# Patient Record
Sex: Female | Born: 1965
Health system: Southern US, Community
[De-identification: ages and names within clinical notes are randomized; demographics above are authoritative.]

## PROBLEM LIST (undated history)

## (undated) DIAGNOSIS — I10 Essential (primary) hypertension: Secondary | ICD-10-CM

## (undated) HISTORY — DX: Essential (primary) hypertension: I10

---

## 2008-08-26 HISTORY — PX: LEG SURGERY: SHX1003

## 2010-10-27 ENCOUNTER — Encounter: Payer: Self-pay | Admitting: Family Medicine

## 2010-10-27 ENCOUNTER — Ambulatory Visit (INDEPENDENT_AMBULATORY_CARE_PROVIDER_SITE_OTHER): Payer: BC Managed Care – PPO | Admitting: Family Medicine

## 2010-10-27 VITALS — BP 140/88 | Temp 100.2°F | Ht 64.0 in | Wt 138.4 lb

## 2010-10-27 DIAGNOSIS — Z0000 Encounter for general adult medical examination without abnormal findings: Secondary | ICD-10-CM | POA: Insufficient documentation

## 2010-10-27 LAB — CBC WITH DIFFERENTIAL/PLATELET
Eosinophils Absolute: 0.1 10*3/uL (ref 0.0–0.7)
Eosinophils Relative: 1 % (ref 0.0–5.0)
HCT: 37.7 % (ref 36.0–46.0)
Lymphs Abs: 1.5 10*3/uL (ref 0.7–4.0)
MCHC: 32.7 g/dL (ref 30.0–36.0)
MCV: 84.3 fl (ref 78.0–100.0)
Monocytes Absolute: 0.4 10*3/uL (ref 0.1–1.0)
Neutrophils Relative %: 64.3 % (ref 43.0–77.0)
Platelets: 247 10*3/uL (ref 150.0–400.0)
RDW: 12.6 % (ref 11.5–14.6)

## 2010-10-27 NOTE — Progress Notes (Signed)
  Subjective:    Patient ID: Margaret Gibson, female    DOB: Aug 14, 1965, 45 y.o.   MRN: [id number]  HPI New to establish.  Recently moved from [place].  UTD on pap and mammo.  Family hx of hyperlipidemia- mom w/ high cholesterol, pt has been watching diet, exercising regularly, taking fish oil.  Pt is hoping to avoid medicine.  Would like fasting labs today.   Review of Systems Patient reports no vision/ hearing  changes, adenopathy, fever, weight change,  persistant / recurrent hoarseness, swallowing issues, chest pain,palpitations,edema,persistant/recurrent cough, hemoptysis, dyspnea (rest/ exertional/paroxysmal nocturnal), gastrointestinal bleeding(melena, rectal bleeding), abdominal pain, significant heartburn, bowel changes,GU symptoms(dysuria, hematuria,pyuria, incontinence), Gyn symptoms(abnormal  bleeding, pain), syncope, focal weakness, memory loss,numbness & tingling, skin/hair/nail changes, abnormal bruising or bleeding, anxiety,or depression.    Objective:   Physical Exam BP 140/88  Temp(Src) 100.2 F (37.9 C) (Oral)  Ht 5\' 4"  (1.626 m)  Wt 138 lb 6 oz (62.766 kg)  BMI 23.75 kg/m2  General Appearance:    Alert, cooperative, no distress, appears stated age  Head:    Normocephalic, without obvious abnormality, atraumatic  Eyes:    PERRL, conjunctiva/corneas clear, EOM's intact, fundi    benign, both eyes  Ears:    Normal TM's and external ear canals, both ears  Nose:   Nares normal, septum midline, mucosa normal, no drainage    or sinus tenderness  Throat:   Lips, mucosa, and tongue normal; teeth and gums normal  Neck:   Supple, symmetrical, trachea midline, no adenopathy;    thyroid:  no enlargement/tenderness/nodules  Back:     Symmetric, no curvature, ROM normal, no CVA tenderness  Lungs:     Clear to auscultation bilaterally, respirations unlabored  Chest Wall:    No tenderness or deformity   Heart:    Regular rate and rhythm, S1 and S2 normal, no murmur, rub   or  gallop  Breast Exam:    GYN  Abdomen:     Soft, non-tender, bowel sounds active all four quadrants,    no masses, no organomegaly  Genitalia:    GYN  Rectal:    GYN  Extremities:   Extremities normal, atraumatic, no cyanosis or edema  Pulses:   2+ and symmetric all extremities  Skin:   Skin color, texture, turgor normal, no rashes or lesions  Lymph nodes:   Cervical, supraclavicular, and axillary nodes normal  Neurologic:   CNII-XII intact, normal strength, sensation and reflexes    throughout         Assessment & Plan:

## 2010-10-27 NOTE — Patient Instructions (Signed)
We'll notify you of your lab results Your exam looks great!  Keep up the good work! Call with any questions or concerns Welcome!  We're glad to have you!

## 2010-10-27 NOTE — Assessment & Plan Note (Signed)
Pt's PE WNL.  Check labs.  UTD on health maintenance.  Anticipatory guidance provided.

## 2010-10-28 LAB — BASIC METABOLIC PANEL
Calcium: 9.2 mg/dL (ref 8.4–10.5)
GFR: 79.52 mL/min (ref >60.00)
Glucose, Bld: 78 mg/dL (ref 70–99)
Potassium: 4.2 mEq/L (ref 3.5–5.1)
Sodium: 139 mEq/L (ref 135–145)

## 2010-10-28 LAB — HEPATIC FUNCTION PANEL
AST: 20 U/L (ref 0–37)
Albumin: 3.7 g/dL (ref 3.5–5.2)
Alkaline Phosphatase: 45 U/L (ref 39–117)
Total Bilirubin: 0.6 mg/dL (ref 0.3–1.2)

## 2010-10-28 LAB — LIPID PANEL
HDL: 62.9 mg/dL (ref >39.00)
Triglycerides: 98 mg/dL (ref 0.0–149.0)
VLDL: 19.6 mg/dL (ref 0.0–40.0)

## 2010-10-28 LAB — TSH: TSH: 2.18 u[IU]/mL (ref 0.35–5.50)

## 2010-11-02 ENCOUNTER — Telehealth: Payer: Self-pay | Admitting: *Deleted

## 2010-11-02 LAB — VITAMIN D 1,25 DIHYDROXY: Vitamin D 1, 25 (OH)2 Total: 70 pg/mL (ref 18–72)

## 2010-11-02 NOTE — Telephone Encounter (Signed)
Message copied by Army Fossa on Mon Nov 02, 2010  3:43 PM ------      Message from: Neena Rhymes      Created: Mon Nov 02, 2010  2:00 PM       Pt's total cholesterol and LDL are both elevated.  i know she is trying to avoid meds but i would recommend starting low dose Simvastatin 20mg  nightly to decrease risk of heart attack and stroke.  Recheck LFTs in 6-8 weeks.            Remainder of labs look great!

## 2010-11-02 NOTE — Telephone Encounter (Signed)
Message left for patient to return my call.  

## 2010-11-03 NOTE — Telephone Encounter (Signed)
I spoke w/ pt she declines starting meds at this time.

## 2011-10-04 ENCOUNTER — Encounter: Payer: Self-pay | Admitting: Family Medicine

## 2011-10-04 ENCOUNTER — Other Ambulatory Visit (HOSPITAL_COMMUNITY)
Admission: RE | Admit: 2011-10-04 | Discharge: 2011-10-04 | Disposition: A | Discharge status: Home / Self Care | Payer: BC Managed Care – PPO | Source: Ambulatory Visit | Attending: Family Medicine | Admitting: Family Medicine

## 2011-10-04 ENCOUNTER — Ambulatory Visit (INDEPENDENT_AMBULATORY_CARE_PROVIDER_SITE_OTHER): Payer: BC Managed Care – PPO | Admitting: Family Medicine

## 2011-10-04 DIAGNOSIS — Z124 Encounter for screening for malignant neoplasm of cervix: Secondary | ICD-10-CM

## 2011-10-04 DIAGNOSIS — Z01419 Encounter for gynecological examination (general) (routine) without abnormal findings: Secondary | ICD-10-CM | POA: Insufficient documentation

## 2011-10-04 DIAGNOSIS — Z0000 Encounter for general adult medical examination without abnormal findings: Secondary | ICD-10-CM

## 2011-10-04 LAB — POCT URINALYSIS DIPSTICK
Bilirubin, UA: NEGATIVE
Glucose, UA: NEGATIVE
Leukocytes, UA: NEGATIVE
Nitrite, UA: NEGATIVE
Urobilinogen, UA: 0.2

## 2011-10-04 LAB — TSH: TSH: 1.93 u[IU]/mL (ref 0.35–5.50)

## 2011-10-04 LAB — BASIC METABOLIC PANEL
BUN: 14 mg/dL (ref 6–23)
Chloride: 105 mEq/L (ref 96–112)
Creatinine, Ser: 0.9 mg/dL (ref 0.4–1.2)
Glucose, Bld: 79 mg/dL (ref 70–99)

## 2011-10-04 LAB — CBC WITH DIFFERENTIAL/PLATELET
Basophils Relative: 0.4 % (ref 0.0–3.0)
Eosinophils Absolute: 0.1 10*3/uL (ref 0.0–0.7)
Hemoglobin: 12.2 g/dL (ref 12.0–15.0)
Lymphocytes Relative: 27.6 % (ref 12.0–46.0)
MCHC: 32.4 g/dL (ref 30.0–36.0)
MCV: 85.2 fl (ref 78.0–100.0)
Neutro Abs: 3.7 10*3/uL (ref 1.4–7.7)
RBC: 4.42 Mil/uL (ref 3.87–5.11)

## 2011-10-04 LAB — LIPID PANEL
Cholesterol: 226 mg/dL — ABNORMAL HIGH (ref 0–200)
VLDL: 14.8 mg/dL (ref 0.0–40.0)

## 2011-10-04 LAB — LDL CHOLESTEROL, DIRECT: Direct LDL: 131.5 mg/dL

## 2011-10-04 LAB — HEPATIC FUNCTION PANEL
ALT: 16 U/L (ref 0–35)
Albumin: 4 g/dL (ref 3.5–5.2)
Total Bilirubin: 0.2 mg/dL — ABNORMAL LOW (ref 0.3–1.2)
Total Protein: 7.8 g/dL (ref 6.0–8.3)

## 2011-10-04 NOTE — Assessment & Plan Note (Signed)
Pt's PE WNL.  UTD on mammo.  Check labs.  Anticipatory guidance provided.  

## 2011-10-04 NOTE — Progress Notes (Signed)
  Subjective:    Patient ID: Margaret Gibson, female    DOB: March 12, 1966, 46 y.o.   MRN: [id number]  HPI CPE- no concerns.  UTD on mammo (09/22/11).  Due for pap today.   Review of Systems Patient reports no vision/ hearing changes, adenopathy,fever, weight change,  persistant/recurrent hoarseness , swallowing issues, chest pain, palpitations, edema, persistant/recurrent cough, hemoptysis, dyspnea (rest/exertional/paroxysmal nocturnal), gastrointestinal bleeding (melena, rectal bleeding), abdominal pain, significant heartburn, bowel changes, GU symptoms (dysuria, hematuria, incontinence), Gyn symptoms (abnormal  bleeding, pain),  syncope, focal weakness, memory loss, numbness & tingling, skin/hair/nail changes, abnormal bruising or bleeding, anxiety, or depression.     Objective:   Physical Exam  General Appearance:    Alert, cooperative, no distress, appears stated age  Head:    Normocephalic, without obvious abnormality, atraumatic  Eyes:    PERRL, conjunctiva/corneas clear, EOM's intact, fundi    benign, both eyes  Ears:    Normal TM's and external ear canals, both ears  Nose:   Nares normal, septum midline, mucosa normal, no drainage    or sinus tenderness  Throat:   Lips, mucosa, and tongue normal; teeth and gums normal  Neck:   Supple, symmetrical, trachea midline, no adenopathy;    Thyroid: no enlargement/tenderness/nodules  Back:     Symmetric, no curvature, ROM normal, no CVA tenderness  Lungs:     Clear to auscultation bilaterally, respirations unlabored  Chest Wall:    No tenderness or deformity   Heart:    Regular rate and rhythm, S1 and S2 normal, no murmur, rub   or gallop  Breast Exam:    No tenderness, masses, or nipple abnormality  Abdomen:     Soft, non-tender, bowel sounds active all four quadrants,    no masses, no organomegaly  Genitalia:    External genitalia normal, cervix normal in appearance, no CMT, uterus in normal size and position, adnexa w/out mass or  tenderness, mucosa pink and moist, no lesions or discharge present  Rectal:    Normal external appearance  Extremities:   Extremities normal, atraumatic, no cyanosis or edema  Pulses:   2+ and symmetric all extremities  Skin:   Skin color, texture, turgor normal, no rashes or lesions  Lymph nodes:   Cervical, supraclavicular, and axillary nodes normal  Neurologic:   CNII-XII intact, normal strength, sensation and reflexes    throughout          Assessment & Plan:

## 2011-10-04 NOTE — Assessment & Plan Note (Signed)
Pap collected. 

## 2011-10-04 NOTE — Patient Instructions (Signed)
Follow up in 1 year or as needed Keep up the good work!  You look great! We'll notify you of your lab results Call with any questions or concerns Happy Spring!!! 

## 2011-10-04 NOTE — Progress Notes (Signed)
Addended by: Silvio Pate D on: 10/04/2011 02:52 PM   Modules accepted: Orders

## 2011-10-06 ENCOUNTER — Encounter: Payer: Self-pay | Admitting: *Deleted

## 2011-10-06 LAB — URINE CULTURE: Organism ID, Bacteria: NO GROWTH

## 2011-10-06 LAB — VITAMIN D 1,25 DIHYDROXY: Vitamin D2 1, 25 (OH)2: <8 pg/mL

## 2011-10-18 ENCOUNTER — Encounter: Payer: Self-pay | Admitting: Family Medicine

## 2011-11-08 ENCOUNTER — Other Ambulatory Visit: Payer: Self-pay | Admitting: Family Medicine

## 2011-11-08 ENCOUNTER — Telehealth: Payer: Self-pay | Admitting: Family Medicine

## 2011-11-08 MED ORDER — ETONOGESTREL-ETHINYL ESTRADIOL 0.12-0.015 MG/24HR VA RING: VAGINAL_RING | VAGINAL | Status: DC

## 2011-11-08 NOTE — Telephone Encounter (Signed)
Patient states her nuvaring should have been called into the pharmacy after her office visit in March and it was not. She now needs this rx today. Dr. Beverely Low has never prescribed this for her. [facility] [address].

## 2011-11-08 NOTE — Telephone Encounter (Signed)
noted 

## 2011-11-08 NOTE — Telephone Encounter (Signed)
Please note

## 2011-11-08 NOTE — Telephone Encounter (Signed)
Patient called to check status of rx refill from earlier today. I advised pt that her request had been sent, that we were working on her request, and that Dr. Beverely Low must approve all rx requests. Patient stated that she was on her way to the pharmacy and that the rx better be ready. Patient was very upset and stated she was coming into the office to obtain a paper copy of the rx to take to the pharmacy. Dr. Beverely Low and Janna Arch were notified. Patient came into office, obtained paper copy of rx from me, and left.

## 2012-09-09 ENCOUNTER — Other Ambulatory Visit: Payer: Self-pay

## 2012-10-06 ENCOUNTER — Other Ambulatory Visit: Payer: Self-pay | Admitting: Family Medicine

## 2012-10-31 ENCOUNTER — Ambulatory Visit (INDEPENDENT_AMBULATORY_CARE_PROVIDER_SITE_OTHER): Payer: BC Managed Care – PPO | Admitting: Family Medicine

## 2012-10-31 ENCOUNTER — Encounter: Payer: Self-pay | Admitting: Family Medicine

## 2012-10-31 VITALS — BP 148/92 | HR 87 | Temp 98.4°F | Ht 64.5 in | Wt 143.0 lb

## 2012-10-31 DIAGNOSIS — Z0000 Encounter for general adult medical examination without abnormal findings: Secondary | ICD-10-CM

## 2012-10-31 DIAGNOSIS — R030 Elevated blood-pressure reading, without diagnosis of hypertension: Secondary | ICD-10-CM

## 2012-10-31 DIAGNOSIS — IMO0001 Reserved for inherently not codable concepts without codable children: Secondary | ICD-10-CM | POA: Insufficient documentation

## 2012-10-31 DIAGNOSIS — Z1331 Encounter for screening for depression: Secondary | ICD-10-CM

## 2012-10-31 LAB — LIPID PANEL
Cholesterol: 228 mg/dL — ABNORMAL HIGH (ref 0–200)
HDL: 65.6 mg/dL (ref >39.00)
Total CHOL/HDL Ratio: 3
VLDL: 10.4 mg/dL (ref 0.0–40.0)

## 2012-10-31 LAB — BASIC METABOLIC PANEL
CO2: 25 mEq/L (ref 19–32)
Calcium: 9.2 mg/dL (ref 8.4–10.5)
Glucose, Bld: 85 mg/dL (ref 70–99)
Sodium: 139 mEq/L (ref 135–145)

## 2012-10-31 LAB — TSH: TSH: 1.85 u[IU]/mL (ref 0.35–5.50)

## 2012-10-31 LAB — CBC WITH DIFFERENTIAL/PLATELET
Eosinophils Relative: 1.1 % (ref 0.0–5.0)
HCT: 36.9 % (ref 36.0–46.0)
Hemoglobin: 12.2 g/dL (ref 12.0–15.0)
Lymphs Abs: 1.1 10*3/uL (ref 0.7–4.0)
Monocytes Relative: 7.7 % (ref 3.0–12.0)
Platelets: 240 10*3/uL (ref 150.0–400.0)
WBC: 4.6 10*3/uL (ref 4.5–10.5)

## 2012-10-31 LAB — HEPATIC FUNCTION PANEL: Alkaline Phosphatase: 47 U/L (ref 39–117)

## 2012-10-31 NOTE — Patient Instructions (Addendum)
Follow up in 1 month to recheck BP Keep up the good work! We'll notify you of your lab results and make any changes if needed Happy Spring!!!

## 2012-10-31 NOTE — Assessment & Plan Note (Signed)
Pt's PE WNL w/ exception of BP.  Check labs.  UTD on health maintenance.  Anticipatory guidance provided.

## 2012-10-31 NOTE — Assessment & Plan Note (Signed)
New.  Pt reports hx of high BP at [facility] b/c 'i just get so excited'.  Pt reports usually this normalizes when she sits for awhile.  BP today much higher than previous.  Will follow up in 1 month to recheck.  If no improvement, will start low dose meds.  Pt expressed understanding and is in agreement w/ plan.

## 2012-10-31 NOTE — Progress Notes (Signed)
  Subjective:    Patient ID: Margaret Gibson, female    DOB: [birth year], 47 y.o.   MRN: [id number]  HPI CPE- UTD on pap (2013), had mammo in Feb.  No  Concerns today.   Review of Systems Patient reports no vision/ hearing changes, adenopathy,fever, weight change,  persistant/recurrent hoarseness , swallowing issues, chest pain, palpitations, edema, persistant/recurrent cough, hemoptysis, dyspnea (rest/exertional/paroxysmal nocturnal), gastrointestinal bleeding (melena, rectal bleeding), abdominal pain, significant heartburn, bowel changes, GU symptoms (dysuria, hematuria, incontinence), Gyn symptoms (abnormal  bleeding, pain),  syncope, focal weakness, memory loss, numbness & tingling, skin/hair/nail changes, abnormal bruising or bleeding, anxiety, or depression.     Objective:   Physical Exam General Appearance:    Alert, cooperative, no distress, appears stated age  Head:    Normocephalic, without obvious abnormality, atraumatic  Eyes:    PERRL, conjunctiva/corneas clear, EOM's intact, fundi    benign, both eyes  Ears:    Normal TM's and external ear canals, both ears  Nose:   Nares normal, septum midline, mucosa normal, no drainage    or sinus tenderness  Throat:   Lips, mucosa, and tongue normal; teeth and gums normal  Neck:   Supple, symmetrical, trachea midline, no adenopathy;    Thyroid: no enlargement/tenderness/nodules  Back:     Symmetric, no curvature, ROM normal, no CVA tenderness  Lungs:     Clear to auscultation bilaterally, respirations unlabored  Chest Wall:    No tenderness or deformity   Heart:    Regular rate and rhythm, S1 and S2 normal, no murmur, rub   or gallop  Breast Exam:    Deferred to mammo  Abdomen:     Soft, non-tender, bowel sounds active all four quadrants,    no masses, no organomegaly  Genitalia:    Deferred  Rectal:    Extremities:   Extremities normal, atraumatic, no cyanosis or edema  Pulses:   2+ and symmetric all extremities  Skin:   Skin color,  texture, turgor normal, no rashes or lesions  Lymph nodes:   Cervical, supraclavicular, and axillary nodes normal  Neurologic:   CNII-XII intact, normal strength, sensation and reflexes    throughout          Assessment & Plan:

## 2012-11-01 ENCOUNTER — Other Ambulatory Visit: Payer: Self-pay | Admitting: *Deleted

## 2012-11-01 MED ORDER — ETONOGESTREL-ETHINYL ESTRADIOL 0.12-0.015 MG/24HR VA RING: VAGINAL_RING | VAGINAL | Status: DC

## 2012-11-01 NOTE — Telephone Encounter (Signed)
Rx sent 

## 2013-05-31 ENCOUNTER — Other Ambulatory Visit: Payer: Self-pay

## 2013-09-25 LAB — HM MAMMOGRAPHY: HM MAMMO: NORMAL

## 2013-09-27 ENCOUNTER — Encounter: Payer: Self-pay | Admitting: Family Medicine

## 2013-11-02 ENCOUNTER — Telehealth: Payer: Self-pay

## 2013-11-02 NOTE — Telephone Encounter (Signed)
Left message for call back Non-identifiable   Pap- 10/04/11-normal MMG- 09/25/13- benign findings Flu Td

## 2013-11-02 NOTE — Telephone Encounter (Signed)
Patient did call you back but she stated that she is at work and does not need a call back because everything is the same.

## 2013-11-05 ENCOUNTER — Encounter: Payer: Self-pay | Admitting: Family Medicine

## 2013-11-05 ENCOUNTER — Ambulatory Visit (INDEPENDENT_AMBULATORY_CARE_PROVIDER_SITE_OTHER): Payer: BC Managed Care – PPO | Admitting: Family Medicine

## 2013-11-05 VITALS — BP 134/84 | HR 104 | Temp 98.4°F | Resp 16 | Ht 65.0 in | Wt 142.5 lb

## 2013-11-05 DIAGNOSIS — Z0000 Encounter for general adult medical examination without abnormal findings: Secondary | ICD-10-CM

## 2013-11-05 DIAGNOSIS — Z23 Encounter for immunization: Secondary | ICD-10-CM

## 2013-11-05 LAB — LIPID PANEL
CHOL/HDL RATIO: 3
Cholesterol: 221 mg/dL — ABNORMAL HIGH (ref 0–200)
HDL: 74.3 mg/dL (ref >39.00)
LDL CALC: 135 mg/dL — AB (ref 0–99)
Triglycerides: 59 mg/dL (ref 0.0–149.0)
VLDL: 11.8 mg/dL (ref 0.0–40.0)

## 2013-11-05 LAB — HEPATIC FUNCTION PANEL
ALK PHOS: 49 U/L (ref 39–117)
ALT: 31 U/L (ref 0–35)
AST: 39 U/L — ABNORMAL HIGH (ref 0–37)
Albumin: 3.9 g/dL (ref 3.5–5.2)
BILIRUBIN DIRECT: 0 mg/dL (ref 0.0–0.3)
BILIRUBIN TOTAL: 0.2 mg/dL — AB (ref 0.3–1.2)
TOTAL PROTEIN: 8 g/dL (ref 6.0–8.3)

## 2013-11-05 LAB — CBC WITH DIFFERENTIAL/PLATELET
BASOS ABS: 0 10*3/uL (ref 0.0–0.1)
BASOS PCT: 0.5 % (ref 0.0–3.0)
EOS ABS: 0.1 10*3/uL (ref 0.0–0.7)
Eosinophils Relative: 1.4 % (ref 0.0–5.0)
HEMATOCRIT: 35.8 % — AB (ref 36.0–46.0)
HEMOGLOBIN: 11.5 g/dL — AB (ref 12.0–15.0)
LYMPHS ABS: 1.4 10*3/uL (ref 0.7–4.0)
LYMPHS PCT: 25 % (ref 12.0–46.0)
MCHC: 32.1 g/dL (ref 30.0–36.0)
MCV: 84.6 fl (ref 78.0–100.0)
Monocytes Absolute: 0.3 10*3/uL (ref 0.1–1.0)
Monocytes Relative: 5.7 % (ref 3.0–12.0)
NEUTROS ABS: 3.7 10*3/uL (ref 1.4–7.7)
Neutrophils Relative %: 67.4 % (ref 43.0–77.0)
Platelets: 222 10*3/uL (ref 150.0–400.0)
RBC: 4.24 Mil/uL (ref 3.87–5.11)
RDW: 12.7 % (ref 11.5–14.6)
WBC: 5.5 10*3/uL (ref 4.5–10.5)

## 2013-11-05 LAB — BASIC METABOLIC PANEL
BUN: 16 mg/dL (ref 6–23)
CALCIUM: 9.3 mg/dL (ref 8.4–10.5)
CO2: 25 mEq/L (ref 19–32)
Chloride: 104 mEq/L (ref 96–112)
Creatinine, Ser: 0.8 mg/dL (ref 0.4–1.2)
GFR: 78.45 mL/min (ref >60.00)
Glucose, Bld: 76 mg/dL (ref 70–99)
POTASSIUM: 4 meq/L (ref 3.5–5.1)
SODIUM: 138 meq/L (ref 135–145)

## 2013-11-05 LAB — TSH: TSH: 1.09 u[IU]/mL (ref 0.35–5.50)

## 2013-11-05 NOTE — Progress Notes (Signed)
   Subjective:    Patient ID: Margaret Gibson, female    DOB: 1966/03/20, 48 y.o.   MRN: [id number]  HPI CPE- UTD on pap and mammo.  No concerns today.   Review of Systems Patient reports no vision/ hearing changes, adenopathy,fever, weight change,  persistant/recurrent hoarseness , swallowing issues, chest pain, palpitations, edema, persistant/recurrent cough, hemoptysis, dyspnea (rest/exertional/paroxysmal nocturnal), gastrointestinal bleeding (melena, rectal bleeding), abdominal pain, significant heartburn, bowel changes, GU symptoms (dysuria, hematuria, incontinence), Gyn symptoms (abnormal  bleeding, pain),  syncope, focal weakness, memory loss, numbness & tingling, skin/hair/nail changes, abnormal bruising or bleeding, anxiety, or depression.     Objective:   Physical Exam General Appearance:    Alert, cooperative, no distress, appears stated age  Head:    Normocephalic, without obvious abnormality, atraumatic  Eyes:    PERRL, conjunctiva/corneas clear, EOM's intact, fundi    benign, both eyes  Ears:    Normal TM's and external ear canals, both ears  Nose:   Nares normal, septum midline, mucosa normal, no drainage    or sinus tenderness  Throat:   Lips, mucosa, and tongue normal; teeth and gums normal  Neck:   Supple, symmetrical, trachea midline, no adenopathy;    Thyroid: no enlargement/tenderness/nodules  Back:     Symmetric, no curvature, ROM normal, no CVA tenderness  Lungs:     Clear to auscultation bilaterally, respirations unlabored  Chest Wall:    No tenderness or deformity   Heart:    Regular rate and rhythm, S1 and S2 normal, no murmur, rub   or gallop  Breast Exam:    Deferred to mammo  Abdomen:     Soft, non-tender, bowel sounds active all four quadrants,    no masses, no organomegaly  Genitalia:    Deferred to GYN  Rectal:    Extremities:   Extremities normal, atraumatic, no cyanosis or edema  Pulses:   2+ and symmetric all extremities  Skin:   Skin color,  texture, turgor normal, no rashes or lesions  Lymph nodes:   Cervical, supraclavicular, and axillary nodes normal  Neurologic:   CNII-XII intact, normal strength, sensation and reflexes    throughout          Assessment & Plan:

## 2013-11-05 NOTE — Patient Instructions (Signed)
Follow up in 1 year or as needed We'll notify you of your lab results and make any changes if needed Keep up the good work!  You look great! Call with any questions or concerns Happy Spring!!! 

## 2013-11-05 NOTE — Assessment & Plan Note (Signed)
Pt's PE WNL.  UTD on pap and mammo.  Check labs.  Tdap updated today.  Anticipatory guidance provided.

## 2013-11-05 NOTE — Addendum Note (Signed)
Addended by: Jackson LatinoYLER, JESSICA L on: 11/05/2013 08:38 AM   Modules accepted: Orders

## 2013-11-05 NOTE — Progress Notes (Signed)
Pre visit review using our clinic review tool, if applicable. No additional management support is needed unless otherwise documented below in the visit note. 

## 2013-11-15 LAB — VITAMIN D 1,25 DIHYDROXY
Vitamin D 1, 25 (OH)2 Total: 76 pg/mL — ABNORMAL HIGH (ref 18–72)
Vitamin D3 1, 25 (OH)2: 76 pg/mL

## 2013-11-29 ENCOUNTER — Other Ambulatory Visit: Payer: Self-pay | Admitting: Family Medicine

## 2013-11-29 NOTE — Telephone Encounter (Signed)
Med filled.  

## 2014-09-26 LAB — HM MAMMOGRAPHY

## 2014-10-01 ENCOUNTER — Encounter: Payer: Self-pay | Admitting: General Practice

## 2014-11-01 ENCOUNTER — Telehealth: Payer: Self-pay | Admitting: Family Medicine

## 2014-11-01 NOTE — Telephone Encounter (Signed)
Caller name: Anne NgKerrie Relation to pt: self Call back number: [phone] Pharmacy:  Reason for call:   Patient refuses to wait until August for her cpe.(we had to reschedule). Patient is wanting to know if she can [facility] Dr. Beverely Lowabori.

## 2014-11-01 NOTE — Telephone Encounter (Signed)
Left detailed message informing patient of this. °

## 2014-11-01 NOTE — Telephone Encounter (Signed)
OK with me.

## 2014-11-01 NOTE — Telephone Encounter (Signed)
Ok for pt to transfer but not sure this will get pt seen sooner (not aware of Melissa's availability)

## 2014-11-11 ENCOUNTER — Other Ambulatory Visit: Payer: Self-pay | Admitting: Family Medicine

## 2014-11-11 NOTE — Telephone Encounter (Signed)
Med filled.  

## 2014-11-20 ENCOUNTER — Other Ambulatory Visit: Payer: Self-pay | Admitting: Family Medicine

## 2014-11-20 NOTE — Telephone Encounter (Signed)
Med filled.  

## 2014-11-25 ENCOUNTER — Other Ambulatory Visit: Payer: Self-pay | Admitting: Family Medicine

## 2014-11-25 NOTE — Telephone Encounter (Signed)
Med filled.  

## 2014-12-11 ENCOUNTER — Telehealth: Payer: Self-pay | Admitting: Family Medicine

## 2014-12-11 NOTE — Telephone Encounter (Signed)
Pre Visit letter sent  °

## 2014-12-26 ENCOUNTER — Encounter: Payer: Self-pay | Admitting: Family Medicine

## 2015-01-01 ENCOUNTER — Telehealth: Payer: Self-pay

## 2015-01-01 NOTE — Telephone Encounter (Signed)
Pre visit screen completed 

## 2015-01-02 ENCOUNTER — Encounter: Payer: Self-pay | Admitting: Family Medicine

## 2015-01-02 ENCOUNTER — Other Ambulatory Visit (HOSPITAL_COMMUNITY)
Admission: RE | Admit: 2015-01-02 | Discharge: 2015-01-02 | Disposition: A | Discharge status: Home / Self Care | Payer: BLUE CROSS/BLUE SHIELD | Source: Ambulatory Visit | Attending: Family Medicine | Admitting: Family Medicine

## 2015-01-02 ENCOUNTER — Ambulatory Visit (INDEPENDENT_AMBULATORY_CARE_PROVIDER_SITE_OTHER): Payer: BLUE CROSS/BLUE SHIELD | Admitting: Family Medicine

## 2015-01-02 VITALS — BP 128/82 | HR 83 | Temp 98.4°F | Resp 16 | Ht 65.25 in | Wt 140.2 lb

## 2015-01-02 DIAGNOSIS — Z124 Encounter for screening for malignant neoplasm of cervix: Secondary | ICD-10-CM | POA: Diagnosis not present

## 2015-01-02 DIAGNOSIS — Z0000 Encounter for general adult medical examination without abnormal findings: Secondary | ICD-10-CM

## 2015-01-02 DIAGNOSIS — Z1151 Encounter for screening for human papillomavirus (HPV): Secondary | ICD-10-CM | POA: Diagnosis present

## 2015-01-02 DIAGNOSIS — Z01419 Encounter for gynecological examination (general) (routine) without abnormal findings: Secondary | ICD-10-CM | POA: Diagnosis not present

## 2015-01-02 LAB — CBC WITH DIFFERENTIAL/PLATELET
BASOS PCT: 0.6 % (ref 0.0–3.0)
Basophils Absolute: 0 10*3/uL (ref 0.0–0.1)
EOS ABS: 0.1 10*3/uL (ref 0.0–0.7)
EOS PCT: 1.8 % (ref 0.0–5.0)
HEMATOCRIT: 38.3 % (ref 36.0–46.0)
HEMOGLOBIN: 12.1 g/dL (ref 12.0–15.0)
Lymphocytes Relative: 25.7 % (ref 12.0–46.0)
Lymphs Abs: 1.2 10*3/uL (ref 0.7–4.0)
MCHC: 31.7 g/dL (ref 30.0–36.0)
MCV: 84.6 fl (ref 78.0–100.0)
MONOS PCT: 7.4 % (ref 3.0–12.0)
Monocytes Absolute: 0.4 10*3/uL (ref 0.1–1.0)
Neutro Abs: 3.1 10*3/uL (ref 1.4–7.7)
Neutrophils Relative %: 64.5 % (ref 43.0–77.0)
Platelets: 244 10*3/uL (ref 150.0–400.0)
RBC: 4.53 Mil/uL (ref 3.87–5.11)
RDW: 12.6 % (ref 11.5–15.5)
WBC: 4.8 10*3/uL (ref 4.0–10.5)

## 2015-01-02 LAB — HEPATIC FUNCTION PANEL
ALBUMIN: 4.1 g/dL (ref 3.5–5.2)
ALT: 34 U/L (ref 0–35)
AST: 41 U/L — ABNORMAL HIGH (ref 0–37)
Alkaline Phosphatase: 59 U/L (ref 39–117)
BILIRUBIN DIRECT: 0.1 mg/dL (ref 0.0–0.3)
BILIRUBIN TOTAL: 0.6 mg/dL (ref 0.2–1.2)
TOTAL PROTEIN: 7.6 g/dL (ref 6.0–8.3)

## 2015-01-02 LAB — LIPID PANEL
CHOL/HDL RATIO: 3
Cholesterol: 233 mg/dL — ABNORMAL HIGH (ref 0–200)
HDL: 75.5 mg/dL (ref >39.00)
LDL Cholesterol: 145 mg/dL — ABNORMAL HIGH (ref 0–99)
NonHDL: 157.5
TRIGLYCERIDES: 61 mg/dL (ref 0.0–149.0)
VLDL: 12.2 mg/dL (ref 0.0–40.0)

## 2015-01-02 LAB — BASIC METABOLIC PANEL
BUN: 9 mg/dL (ref 6–23)
CO2: 28 meq/L (ref 19–32)
Calcium: 9.3 mg/dL (ref 8.4–10.5)
Chloride: 103 mEq/L (ref 96–112)
Creatinine, Ser: 0.89 mg/dL (ref 0.40–1.20)
GFR: 72.02 mL/min (ref >60.00)
Glucose, Bld: 81 mg/dL (ref 70–99)
Potassium: 3.7 mEq/L (ref 3.5–5.1)
SODIUM: 137 meq/L (ref 135–145)

## 2015-01-02 LAB — TSH: TSH: 1.04 u[IU]/mL (ref 0.35–4.50)

## 2015-01-02 LAB — VITAMIN D 25 HYDROXY (VIT D DEFICIENCY, FRACTURES): VITD: 45.75 ng/mL (ref 30.00–100.00)

## 2015-01-02 NOTE — Progress Notes (Signed)
Pre visit review using our clinic review tool, if applicable. No additional management support is needed unless otherwise documented below in the visit note. 

## 2015-01-02 NOTE — Patient Instructions (Signed)
Follow up in 1 year or as needed We'll notify you of your lab results and make any changes if needed Keep up the good work!  You look great! Call with any questions or concerns Have a great summer!!! 

## 2015-01-02 NOTE — Progress Notes (Signed)
   Subjective:    Patient ID: Margaret Gibson, female    DOB: 04-25-1966, 49 y.o.   MRN: [id number]  HPI CPE- due for pap.  UTD on mammo.  No concerns.   Review of Systems Patient reports no vision/ hearing changes, adenopathy,fever, weight change,  persistant/recurrent hoarseness , swallowing issues, chest pain, palpitations, edema, persistant/recurrent cough, hemoptysis, dyspnea (rest/exertional/paroxysmal nocturnal), gastrointestinal bleeding (melena, rectal bleeding), abdominal pain, significant heartburn, bowel changes, GU symptoms (dysuria, hematuria, incontinence), Gyn symptoms (abnormal  bleeding, pain),  syncope, focal weakness, memory loss, numbness & tingling, skin/hair/nail changes, abnormal bruising or bleeding, anxiety, or depression.     Objective:   Physical Exam  General Appearance:    Alert, cooperative, no distress, appears stated age  Head:    Normocephalic, without obvious abnormality, atraumatic  Eyes:    PERRL, conjunctiva/corneas clear, EOM's intact, fundi    benign, both eyes  Ears:    Normal TM's and external ear canals, both ears  Nose:   Nares normal, septum midline, mucosa normal, no drainage    or sinus tenderness  Throat:   Lips, mucosa, and tongue normal; teeth and gums normal  Neck:   Supple, symmetrical, trachea midline, no adenopathy;    Thyroid: no enlargement/tenderness/nodules  Back:     Symmetric, no curvature, ROM normal, no CVA tenderness  Lungs:     Clear to auscultation bilaterally, respirations unlabored  Chest Wall:    No tenderness or deformity   Heart:    Regular rate and rhythm, S1 and S2 normal, no murmur, rub   or gallop  Breast Exam:    No tenderness, masses, or nipple abnormality  Abdomen:     Soft, non-tender, bowel sounds active all four quadrants,    no masses, no organomegaly  Genitalia:    External genitalia normal, cervix normal in appearance, no CMT, uterus in normal size and position, adnexa w/out mass or tenderness, mucosa  pink and moist, no lesions or discharge present  Rectal:    Normal external appearance  Extremities:   Extremities normal, atraumatic, no cyanosis or edema  Pulses:   2+ and symmetric all extremities  Skin:   Skin color, texture, turgor normal, no rashes or lesions  Lymph nodes:   Cervical, supraclavicular, and axillary nodes normal  Neurologic:   CNII-XII intact, normal strength, sensation and reflexes    throughout          Assessment & Plan:

## 2015-01-02 NOTE — Assessment & Plan Note (Signed)
Pap collected. 

## 2015-01-02 NOTE — Assessment & Plan Note (Signed)
Pt's PE WNL.  UTD on mammo.  Pap collected today.  Check labs.  Anticipatory guidance provided.  

## 2015-01-03 LAB — CYTOLOGY - PAP

## 2015-09-04 ENCOUNTER — Telehealth: Payer: Self-pay | Admitting: Family Medicine

## 2015-09-04 MED ORDER — ETONOGESTREL-ETHINYL ESTRADIOL 0.12-0.015 MG/24HR VA RING: VAGINAL_RING | VAGINAL | Status: DC

## 2015-09-04 NOTE — Telephone Encounter (Signed)
Relation to ZO:XWRU Call back [phone] Pharmacy: [id number] - [place],  - [address] AT [facility] [address] [facility] [address] [phone] (Phone) [phone] (Fax)         Reason for call:  Patient requesting a refill NUVARING 0.12-0.015 MG/24HR vaginal ring

## 2015-09-04 NOTE — Telephone Encounter (Signed)
CPE within the last year, patient is transferring care.  Rx faxed.     KP

## 2015-09-28 ENCOUNTER — Other Ambulatory Visit: Payer: Self-pay | Admitting: Family Medicine

## 2015-09-29 NOTE — Telephone Encounter (Signed)
Medication filled to pharmacy as requested.   

## 2015-09-30 LAB — HM MAMMOGRAPHY

## 2015-10-03 ENCOUNTER — Encounter: Payer: Self-pay | Admitting: Family Medicine

## 2015-12-08 ENCOUNTER — Ambulatory Visit: Payer: BLUE CROSS/BLUE SHIELD | Admitting: Family Medicine

## 2015-12-15 ENCOUNTER — Ambulatory Visit: Payer: BLUE CROSS/BLUE SHIELD | Admitting: Family Medicine

## 2016-01-12 ENCOUNTER — Encounter: Payer: BLUE CROSS/BLUE SHIELD | Admitting: Family Medicine

## 2016-02-02 ENCOUNTER — Other Ambulatory Visit: Payer: Self-pay | Admitting: Family Medicine

## 2016-02-02 ENCOUNTER — Encounter: Payer: Self-pay | Admitting: Family Medicine

## 2016-02-02 ENCOUNTER — Ambulatory Visit (INDEPENDENT_AMBULATORY_CARE_PROVIDER_SITE_OTHER): Payer: BLUE CROSS/BLUE SHIELD | Admitting: Family Medicine

## 2016-02-02 VITALS — BP 156/84 | HR 87 | Temp 97.9°F | Ht 65.0 in | Wt 135.8 lb

## 2016-02-02 DIAGNOSIS — R030 Elevated blood-pressure reading, without diagnosis of hypertension: Secondary | ICD-10-CM

## 2016-02-02 DIAGNOSIS — K625 Hemorrhage of anus and rectum: Secondary | ICD-10-CM | POA: Diagnosis not present

## 2016-02-02 DIAGNOSIS — Z0000 Encounter for general adult medical examination without abnormal findings: Secondary | ICD-10-CM | POA: Diagnosis not present

## 2016-02-02 DIAGNOSIS — Z3040 Encounter for surveillance of contraceptives, unspecified: Secondary | ICD-10-CM | POA: Diagnosis not present

## 2016-02-02 DIAGNOSIS — IMO0001 Reserved for inherently not codable concepts without codable children: Secondary | ICD-10-CM

## 2016-02-02 LAB — CBC WITH DIFFERENTIAL/PLATELET
BASOS ABS: 0.1 10*3/uL (ref 0.0–0.1)
BASOS PCT: 1 % (ref 0.0–3.0)
EOS ABS: 0 10*3/uL (ref 0.0–0.7)
Eosinophils Relative: 0.2 % (ref 0.0–5.0)
HEMATOCRIT: 36.7 % (ref 36.0–46.0)
HEMOGLOBIN: 11.9 g/dL — AB (ref 12.0–15.0)
LYMPHS PCT: 21.4 % (ref 12.0–46.0)
Lymphs Abs: 1.4 10*3/uL (ref 0.7–4.0)
MCHC: 32.4 g/dL (ref 30.0–36.0)
MCV: 82.6 fl (ref 78.0–100.0)
MONO ABS: 0.3 10*3/uL (ref 0.1–1.0)
Monocytes Relative: 5.1 % (ref 3.0–12.0)
NEUTROS ABS: 4.8 10*3/uL (ref 1.4–7.7)
Neutrophils Relative %: 72.3 % (ref 43.0–77.0)
Platelets: 228 10*3/uL (ref 150.0–400.0)
RBC: 4.44 Mil/uL (ref 3.87–5.11)
RDW: 12.6 % (ref 11.5–15.5)
WBC: 6.6 10*3/uL (ref 4.0–10.5)

## 2016-02-02 LAB — POCT URINALYSIS DIPSTICK
Bilirubin, UA: NEGATIVE
Blood, UA: NEGATIVE
Glucose, UA: NEGATIVE
Leukocytes, UA: NEGATIVE
Nitrite, UA: NEGATIVE
PROTEIN UA: NEGATIVE
UROBILINOGEN UA: 0.2
pH, UA: 5.5

## 2016-02-02 LAB — COMPREHENSIVE METABOLIC PANEL
ALBUMIN: 4.4 g/dL (ref 3.5–5.2)
ALT: 13 U/L (ref 0–35)
AST: 22 U/L (ref 0–37)
Alkaline Phosphatase: 46 U/L (ref 39–117)
BILIRUBIN TOTAL: 0.6 mg/dL (ref 0.2–1.2)
BUN: 14 mg/dL (ref 6–23)
CALCIUM: 9.7 mg/dL (ref 8.4–10.5)
CHLORIDE: 102 meq/L (ref 96–112)
CO2: 24 mEq/L (ref 19–32)
CREATININE: 0.85 mg/dL (ref 0.40–1.20)
GFR: 75.6 mL/min (ref >60.00)
Glucose, Bld: 77 mg/dL (ref 70–99)
Potassium: 4.1 mEq/L (ref 3.5–5.1)
SODIUM: 134 meq/L — AB (ref 135–145)
Total Protein: 8 g/dL (ref 6.0–8.3)

## 2016-02-02 LAB — LIPID PANEL
CHOL/HDL RATIO: 3
Cholesterol: 228 mg/dL — ABNORMAL HIGH (ref 0–200)
HDL: 74.2 mg/dL (ref >39.00)
LDL CALC: 139 mg/dL — AB (ref 0–99)
NonHDL: 153.65
Triglycerides: 71 mg/dL (ref 0.0–149.0)
VLDL: 14.2 mg/dL (ref 0.0–40.0)

## 2016-02-02 LAB — TSH: TSH: 1.34 u[IU]/mL (ref 0.35–4.50)

## 2016-02-02 MED ORDER — ETONOGESTREL-ETHINYL ESTRADIOL 0.12-0.015 MG/24HR VA RING: VAGINAL_RING | VAGINAL | Status: DC

## 2016-02-02 NOTE — Progress Notes (Signed)
Subjective:     Margaret Gibson is a 50 y.o. female and is here for a comprehensive physical exam. The patient reports no problems.  Social History   Social History  . Marital Status: Married    Spouse Name: N/A  . Number of Children: N/A  . Years of Education: N/A   Occupational History  .  [employer]   Social History Main Topics  . Smoking status: Never Smoker   . Smokeless tobacco: Not on file  . Alcohol Use: No  . Drug Use: No  . Sexual Activity: Yes   Other Topics Concern  . Not on file   Social History Narrative   Exercise -- pedometer 10.000 steps   Health Maintenance  Topic Date Due  . HIV Screening  02/01/2017 (Originally 03/25/1982)  . INFLUENZA VACCINE  02/24/2016  . MAMMOGRAM  09/29/2016  . PAP SMEAR  01/01/2018  . TETANUS/TDAP  11/06/2023    The following portions of the patient's history were reviewed and updated as appropriate:  She  has no past medical history on file. She  does not have any pertinent problems on file. She  has past surgical history that includes Leg Surgery (08/2008). Her family history includes Cancer (age of onset: 4846) in her sister; Hyperlipidemia in her mother; Hypertension in her father. She  reports that she has never smoked. She does not have any smokeless tobacco history on file. She reports that she does not drink alcohol or use illicit drugs. She has a current medication list which includes the following prescription(s): etonogestrel-ethinyl estradiol, flaxseed (linseed), garlic, hair/skin/nails, and fish oil triple strength. Current Outpatient Prescriptions on File Prior to Visit  Medication Sig Dispense Refill  . Flaxseed, Linseed, (FLAX SEED OIL PO) Take by mouth daily.      Marland Kitchen. GARLIC PO Take by mouth.    . Multiple Vitamins-Minerals (HAIR/SKIN/NAILS) TABS Take by mouth daily.      . Omega-3 Fatty Acids (FISH OIL TRIPLE STRENGTH) 1400 MG CAPS Take by mouth daily.       No current facility-administered  medications on file prior to visit.   She has No Known Allergies..  Review of Systems Review of Systems   Constitutional: Negative for activity change, appetite change and fatigue.  HENT: Negative for hearing loss, congestion, tinnitus and ear discharge.  dentist [id number] Eyes: Negative for visual disturbance (see optho q1y -- vision corrected to 20/20 with glasses).  Respiratory: Negative for cough, chest tightness and shortness of breath.   Cardiovascular: Negative for chest pain, palpitations and leg swelling.  Gastrointestinal: Negative for abdominal pain, diarrhea, constipation and abdominal distention.  Genitourinary: Negative for urgency, frequency, decreased urine volume and difficulty urinating.  Musculoskeletal: Negative for back pain, arthralgias and gait problem.  Skin: Negative for color change, pallor and rash.  Neurological: Negative for dizziness, light-headedness, numbness and headaches.  Hematological: Negative for adenopathy. Does not bruise/bleed easily.  Psychiatric/Behavioral: Negative for suicidal ideas, confusion, sleep disturbance, self-injury, dysphoric mood, decreased concentration and agitation.       Objective:    BP 156/84 mmHg  Pulse 87  Temp(Src) 97.9 F (36.6 C) (Oral)  Ht 5\' 5"  (1.651 m)  Wt 135 lb 12.8 oz (61.598 kg)  BMI 22.60 kg/m2  SpO2 99%  LMP 01/19/2016 General appearance: alert, cooperative, appears stated age and no distress Head: Normocephalic, without obvious abnormality, atraumatic Eyes: conjunctivae/corneas clear. PERRL, EOM's intact. Fundi benign. Ears: normal TM's and external ear canals both ears Nose: Nares  normal. Septum midline. Mucosa normal. No drainage or sinus tenderness. Throat: lips, mucosa, and tongue normal; teeth and gums normal Neck: no adenopathy, no carotid bruit, no JVD, supple, symmetrical, trachea midline and thyroid not enlarged, symmetric, no tenderness/mass/nodules Back: symmetric, no curvature. ROM normal. No  CVA tenderness. Lungs: clear to auscultation bilaterally Breasts: normal appearance, no masses or tenderness Heart: regular rate and rhythm, S1, S2 normal, no murmur, click, rub or gallop Abdomen: soft, non-tender; bowel sounds normal; no masses,  no organomegaly Pelvic: deferred Extremities: extremities normal, atraumatic, no cyanosis or edema Pulses: 2+ and symmetric Skin: Skin color, texture, turgor normal. No rashes or lesions Lymph nodes: Cervical, supraclavicular, and axillary nodes normal. Neurologic: Alert and oriented X 3, normal strength and tone. Normal symmetric reflexes. Normal coordination and gait Psych- no depression, no anxiety      Assessment:    Healthy female exam.       Plan:    ghm utd Check labs See avs See After Visit Summary for Counseling Recommendations

## 2016-02-02 NOTE — Progress Notes (Signed)
Pre visit review using our clinic review tool, if applicable. No additional management support is needed unless otherwise documented below in the visit note. 

## 2016-02-02 NOTE — Assessment & Plan Note (Signed)
Pt states it is always high in drs office and then goes down She will monitor at home and send in bp readings

## 2016-02-02 NOTE — Patient Instructions (Signed)
Preventive Care for Adults, Female A healthy lifestyle and preventive care can promote health and wellness. Preventive health guidelines for women include the following key practices.  A routine yearly physical is a good way to check with your health care provider about your health and preventive screening. It is a chance to share any concerns and updates on your health and to receive a thorough exam.  Visit your dentist for a routine exam and preventive care every 6 months. Brush your teeth twice a day and floss once a day. Good oral hygiene prevents tooth decay and gum disease.  The frequency of eye exams is based on your age, health, family medical history, use of contact lenses, and other factors. Follow your health care provider's recommendations for frequency of eye exams.  Eat a healthy diet. Foods like vegetables, fruits, whole grains, low-fat dairy products, and lean protein foods contain the nutrients you need without too many calories. Decrease your intake of foods high in solid fats, added sugars, and salt. Eat the right amount of calories for you.Get information about a proper diet from your health care provider, if necessary.  Regular physical exercise is one of the most important things you can do for your health. Most adults should get at least 150 minutes of moderate-intensity exercise (any activity that increases your heart rate and causes you to sweat) each week. In addition, most adults need muscle-strengthening exercises on 2 or more days a week.  Maintain a healthy weight. The body mass index (BMI) is a screening tool to identify possible weight problems. It provides an estimate of body fat based on height and weight. Your health care provider can find your BMI and can help you achieve or maintain a healthy weight.For adults 20 years and older:  A BMI below 18.5 is considered underweight.  A BMI of 18.5 to 24.9 is normal.  A BMI of 25 to 29.9 is considered overweight.  A  BMI of 30 and above is considered obese.  Maintain normal blood lipids and cholesterol levels by exercising and minimizing your intake of saturated fat. Eat a balanced diet with plenty of fruit and vegetables. Blood tests for lipids and cholesterol should begin at age 45 and be repeated every 5 years. If your lipid or cholesterol levels are high, you are over 50, or you are at high risk for heart disease, you may need your cholesterol levels checked more frequently.Ongoing high lipid and cholesterol levels should be treated with medicines if diet and exercise are not working.  If you smoke, find out from your health care provider how to quit. If you do not use tobacco, do not start.  Lung cancer screening is recommended for adults aged 45-80 years who are at high risk for developing lung cancer because of a history of smoking. A yearly low-dose CT scan of the lungs is recommended for people who have at least a 30-pack-year history of smoking and are a current smoker or have quit within the past 15 years. A pack year of smoking is smoking an average of 1 pack of cigarettes a day for 1 year (for example: 1 pack a day for 30 years or 2 packs a day for 15 years). Yearly screening should continue until the smoker has stopped smoking for at least 15 years. Yearly screening should be stopped for people who develop a health problem that would prevent them from having lung cancer treatment.  If you are pregnant, do not drink alcohol. If you are  breastfeeding, be very cautious about drinking alcohol. If you are not pregnant and choose to drink alcohol, do not have more than 1 drink per day. One drink is considered to be 12 ounces (355 mL) of beer, 5 ounces (148 mL) of wine, or 1.5 ounces (44 mL) of liquor.  Avoid use of street drugs. Do not share needles with anyone. Ask for help if you need support or instructions about stopping the use of drugs.  High blood pressure causes heart disease and increases the risk  of stroke. Your blood pressure should be checked at least every 1 to 2 years. Ongoing high blood pressure should be treated with medicines if weight loss and exercise do not work.  If you are 55-79 years old, ask your health care provider if you should take aspirin to prevent strokes.  Diabetes screening is done by taking a blood sample to check your blood glucose level after you have not eaten for a certain period of time (fasting). If you are not overweight and you do not have risk factors for diabetes, you should be screened once every 3 years starting at age 45. If you are overweight or obese and you are 40-70 years of age, you should be screened for diabetes every year as part of your cardiovascular risk assessment.  Breast cancer screening is essential preventive care for women. You should practice "breast self-awareness." This means understanding the normal appearance and feel of your breasts and may include breast self-examination. Any changes detected, no matter how small, should be reported to a health care provider. Women in their 20s and 30s should have a clinical breast exam (CBE) by a health care provider as part of a regular health exam every 1 to 3 years. After age 40, women should have a CBE every year. Starting at age 40, women should consider having a mammogram (breast X-ray test) every year. Women who have a family history of breast cancer should talk to their health care provider about genetic screening. Women at a high risk of breast cancer should talk to their health care providers about having an MRI and a mammogram every year.  Breast cancer gene (BRCA)-related cancer risk assessment is recommended for women who have family members with BRCA-related cancers. BRCA-related cancers include breast, ovarian, tubal, and peritoneal cancers. Having family members with these cancers may be associated with an increased risk for harmful changes (mutations) in the breast cancer genes BRCA1 and  BRCA2. Results of the assessment will determine the need for genetic counseling and BRCA1 and BRCA2 testing.  Your health care provider may recommend that you be screened regularly for cancer of the pelvic organs (ovaries, uterus, and vagina). This screening involves a pelvic examination, including checking for microscopic changes to the surface of your cervix (Pap test). You may be encouraged to have this screening done every 3 years, beginning at age 21.  For women ages 30-65, health care providers may recommend pelvic exams and Pap testing every 3 years, or they may recommend the Pap and pelvic exam, combined with testing for human papilloma virus (HPV), every 5 years. Some types of HPV increase your risk of cervical cancer. Testing for HPV may also be done on women of any age with unclear Pap test results.  Other health care providers may not recommend any screening for nonpregnant women who are considered low risk for pelvic cancer and who do not have symptoms. Ask your health care provider if a screening pelvic exam is right for   you.  If you have had past treatment for cervical cancer or a condition that could lead to cancer, you need Pap tests and screening for cancer for at least 20 years after your treatment. If Pap tests have been discontinued, your risk factors (such as having a new sexual partner) need to be reassessed to determine if screening should resume. Some women have medical problems that increase the chance of getting cervical cancer. In these cases, your health care provider may recommend more frequent screening and Pap tests.  Colorectal cancer can be detected and often prevented. Most routine colorectal cancer screening begins at the age of 50 years and continues through age 75 years. However, your health care provider may recommend screening at an earlier age if you have risk factors for colon cancer. On a yearly basis, your health care provider may provide home test kits to check  for hidden blood in the stool. Use of a small camera at the end of a tube, to directly examine the colon (sigmoidoscopy or colonoscopy), can detect the earliest forms of colorectal cancer. Talk to your health care provider about this at age 50, when routine screening begins. Direct exam of the colon should be repeated every 5-10 years through age 75 years, unless early forms of precancerous polyps or small growths are found.  People who are at an increased risk for hepatitis B should be screened for this virus. You are considered at high risk for hepatitis B if:  You were born in a country where hepatitis B occurs often. Talk with your health care provider about which countries are considered high risk.  Your parents were born in a high-risk country and you have not received a shot to protect against hepatitis B (hepatitis B vaccine).  You have HIV or AIDS.  You use needles to inject street drugs.  You live with, or have sex with, someone who has hepatitis B.  You get hemodialysis treatment.  You take certain medicines for conditions like cancer, organ transplantation, and autoimmune conditions.  Hepatitis C blood testing is recommended for all people born from 1945 through 1965 and any individual with known risks for hepatitis C.  Practice safe sex. Use condoms and avoid high-risk sexual practices to reduce the spread of sexually transmitted infections (STIs). STIs include gonorrhea, chlamydia, syphilis, trichomonas, herpes, HPV, and human immunodeficiency virus (HIV). Herpes, HIV, and HPV are viral illnesses that have no cure. They can result in disability, cancer, and death.  You should be screened for sexually transmitted illnesses (STIs) including gonorrhea and chlamydia if:  You are sexually active and are younger than 24 years.  You are older than 24 years and your health care provider tells you that you are at risk for this type of infection.  Your sexual activity has changed  since you were last screened and you are at an increased risk for chlamydia or gonorrhea. Ask your health care provider if you are at risk.  If you are at risk of being infected with HIV, it is recommended that you take a prescription medicine daily to prevent HIV infection. This is called preexposure prophylaxis (PrEP). You are considered at risk if:  You are sexually active and do not regularly use condoms or know the HIV status of your partner(s).  You take drugs by injection.  You are sexually active with a partner who has HIV.  Talk with your health care provider about whether you are at high risk of being infected with HIV. If   you choose to begin PrEP, you should first be tested for HIV. You should then be tested every 3 months for as long as you are taking PrEP.  Osteoporosis is a disease in which the bones lose minerals and strength with aging. This can result in serious bone fractures or breaks. The risk of osteoporosis can be identified using a bone density scan. Women ages 67 years and over and women at risk for fractures or osteoporosis should discuss screening with their health care providers. Ask your health care provider whether you should take a calcium supplement or vitamin D to reduce the rate of osteoporosis.  Menopause can be associated with physical symptoms and risks. Hormone replacement therapy is available to decrease symptoms and risks. You should talk to your health care provider about whether hormone replacement therapy is right for you.  Use sunscreen. Apply sunscreen liberally and repeatedly throughout the day. You should seek shade when your shadow is shorter than you. Protect yourself by wearing long sleeves, pants, a wide-brimmed hat, and sunglasses year round, whenever you are outdoors.  Once a month, do a whole body skin exam, using a mirror to look at the skin on your back. Tell your health care provider of new moles, moles that have irregular borders, moles that  are larger than a pencil eraser, or moles that have changed in shape or color.  Stay current with required vaccines (immunizations).  Influenza vaccine. All adults should be immunized every year.  Tetanus, diphtheria, and acellular pertussis (Td, Tdap) vaccine. Pregnant women should receive 1 dose of Tdap vaccine during each pregnancy. The dose should be obtained regardless of the length of time since the last dose. Immunization is preferred during the 27th-36th week of gestation. An adult who has not previously received Tdap or who does not know her vaccine status should receive 1 dose of Tdap. This initial dose should be followed by tetanus and diphtheria toxoids (Td) booster doses every 10 years. Adults with an unknown or incomplete history of completing a 3-dose immunization series with Td-containing vaccines should begin or complete a primary immunization series including a Tdap dose. Adults should receive a Td booster every 10 years.  Varicella vaccine. An adult without evidence of immunity to varicella should receive 2 doses or a second dose if she has previously received 1 dose. Pregnant females who do not have evidence of immunity should receive the first dose after pregnancy. This first dose should be obtained before leaving the health care facility. The second dose should be obtained 4-8 weeks after the first dose.  Human papillomavirus (HPV) vaccine. Females aged 13-26 years who have not received the vaccine previously should obtain the 3-dose series. The vaccine is not recommended for use in pregnant females. However, pregnancy testing is not needed before receiving a dose. If a female is found to be pregnant after receiving a dose, no treatment is needed. In that case, the remaining doses should be delayed until after the pregnancy. Immunization is recommended for any person with an immunocompromised condition through the age of 61 years if she did not get any or all doses earlier. During the  3-dose series, the second dose should be obtained 4-8 weeks after the first dose. The third dose should be obtained 24 weeks after the first dose and 16 weeks after the second dose.  Zoster vaccine. One dose is recommended for adults aged 30 years or older unless certain conditions are present.  Measles, mumps, and rubella (MMR) vaccine. Adults born  before 1957 generally are considered immune to measles and mumps. Adults born in 1957 or later should have 1 or more doses of MMR vaccine unless there is a contraindication to the vaccine or there is laboratory evidence of immunity to each of the three diseases. A routine second dose of MMR vaccine should be obtained at least 28 days after the first dose for students attending postsecondary schools, health care workers, or international travelers. People who received inactivated measles vaccine or an unknown type of measles vaccine during 1963-1967 should receive 2 doses of MMR vaccine. People who received inactivated mumps vaccine or an unknown type of mumps vaccine before 1979 and are at high risk for mumps infection should consider immunization with 2 doses of MMR vaccine. For females of childbearing age, rubella immunity should be determined. If there is no evidence of immunity, females who are not pregnant should be vaccinated. If there is no evidence of immunity, females who are pregnant should delay immunization until after pregnancy. Unvaccinated health care workers born before 1957 who lack laboratory evidence of measles, mumps, or rubella immunity or laboratory confirmation of disease should consider measles and mumps immunization with 2 doses of MMR vaccine or rubella immunization with 1 dose of MMR vaccine.  Pneumococcal 13-valent conjugate (PCV13) vaccine. When indicated, a person who is uncertain of his immunization history and has no record of immunization should receive the PCV13 vaccine. All adults 65 years of age and older should receive this  vaccine. An adult aged 19 years or older who has certain medical conditions and has not been previously immunized should receive 1 dose of PCV13 vaccine. This PCV13 should be followed with a dose of pneumococcal polysaccharide (PPSV23) vaccine. Adults who are at high risk for pneumococcal disease should obtain the PPSV23 vaccine at least 8 weeks after the dose of PCV13 vaccine. Adults older than 49 years of age who have normal immune system function should obtain the PPSV23 vaccine dose at least 1 year after the dose of PCV13 vaccine.  Pneumococcal polysaccharide (PPSV23) vaccine. When PCV13 is also indicated, PCV13 should be obtained first. All adults aged 65 years and older should be immunized. An adult younger than age 65 years who has certain medical conditions should be immunized. Any person who resides in a nursing home or long-term care facility should be immunized. An adult smoker should be immunized. People with an immunocompromised condition and certain other conditions should receive both PCV13 and PPSV23 vaccines. People with human immunodeficiency virus (HIV) infection should be immunized as soon as possible after diagnosis. Immunization during chemotherapy or radiation therapy should be avoided. Routine use of PPSV23 vaccine is not recommended for American Indians, [place] Natives, or people younger than 65 years unless there are medical conditions that require PPSV23 vaccine. When indicated, people who have unknown immunization and have no record of immunization should receive PPSV23 vaccine. One-time revaccination 5 years after the first dose of PPSV23 is recommended for people aged 19-64 years who have chronic kidney failure, nephrotic syndrome, asplenia, or immunocompromised conditions. People who received 1-2 doses of PPSV23 before age 65 years should receive another dose of PPSV23 vaccine at age 65 years or later if at least 5 years have passed since the previous dose. Doses of PPSV23 are not  needed for people immunized with PPSV23 at or after age 65 years.  Meningococcal vaccine. Adults with asplenia or persistent complement component deficiencies should receive 2 doses of quadrivalent meningococcal conjugate (MenACWY-D) vaccine. The doses should be obtained   at least 2 months apart. Microbiologists working with certain meningococcal bacteria, [place] recruits, people at risk during an outbreak, and people who travel to or live in countries with a high rate of meningitis should be immunized. A first-year college student up through age 34 years who is living in a residence hall should receive a dose if she did not receive a dose on or after her 16th birthday. Adults who have certain high-risk conditions should receive one or more doses of vaccine.  Hepatitis A vaccine. Adults who wish to be protected from this disease, have certain high-risk conditions, work with hepatitis A-infected animals, work in hepatitis A research labs, or travel to or work in countries with a high rate of hepatitis A should be immunized. Adults who were previously unvaccinated and who anticipate close contact with an international adoptee during the first 60 days after arrival in the [place] from a country with a high rate of hepatitis A should be immunized.  Hepatitis B vaccine. Adults who wish to be protected from this disease, have certain high-risk conditions, may be exposed to blood or other infectious body fluids, are household contacts or sex partners of hepatitis B positive people, are clients or workers in certain care facilities, or travel to or work in countries with a high rate of hepatitis B should be immunized.  Haemophilus influenzae type b (Hib) vaccine. A previously unvaccinated person with asplenia or sickle cell disease or having a scheduled splenectomy should receive 1 dose of Hib vaccine. Regardless of previous immunization, a recipient of a hematopoietic stem cell transplant should receive a  3-dose series 6-12 months after her successful transplant. Hib vaccine is not recommended for adults with HIV infection. Preventive Services / Frequency Ages 35 to 4 years  Blood pressure check.** / Every 3-5 years.  Lipid and cholesterol check.** / Every 5 years beginning at age 60.  Clinical breast exam.** / Every 3 years for women in their 71s and 10s.  BRCA-related cancer risk assessment.** / For women who have family members with a BRCA-related cancer (breast, ovarian, tubal, or peritoneal cancers).  Pap test.** / Every 2 years from ages 76 through 26. Every 3 years starting at age 61 through age 76 or 93 with a history of 3 consecutive normal Pap tests.  HPV screening.** / Every 3 years from ages 37 through ages 60 to 51 with a history of 3 consecutive normal Pap tests.  Hepatitis C blood test.** / For any individual with known risks for hepatitis C.  Skin self-exam. / Monthly.  Influenza vaccine. / Every year.  Tetanus, diphtheria, and acellular pertussis (Tdap, Td) vaccine.** / Consult your health care provider. Pregnant women should receive 1 dose of Tdap vaccine during each pregnancy. 1 dose of Td every 10 years.  Varicella vaccine.** / Consult your health care provider. Pregnant females who do not have evidence of immunity should receive the first dose after pregnancy.  HPV vaccine. / 3 doses over 6 months, if 93 and younger. The vaccine is not recommended for use in pregnant females. However, pregnancy testing is not needed before receiving a dose.  Measles, mumps, rubella (MMR) vaccine.** / You need at least 1 dose of MMR if you were born in 1957 or later. You may also need a 2nd dose. For females of childbearing age, rubella immunity should be determined. If there is no evidence of immunity, females who are not pregnant should be vaccinated. If there is no evidence of immunity, females who are  pregnant should delay immunization until after pregnancy.  Pneumococcal  13-valent conjugate (PCV13) vaccine.** / Consult your health care provider.  Pneumococcal polysaccharide (PPSV23) vaccine.** / 1 to 2 doses if you smoke cigarettes or if you have certain conditions.  Meningococcal vaccine.** / 1 dose if you are age 68 to 8 years and a Market researcher living in a residence hall, or have one of several medical conditions, you need to get vaccinated against meningococcal disease. You may also need additional booster doses.  Hepatitis A vaccine.** / Consult your health care provider.  Hepatitis B vaccine.** / Consult your health care provider.  Haemophilus influenzae type b (Hib) vaccine.** / Consult your health care provider. Ages 7 to 53 years  Blood pressure check.** / Every year.  Lipid and cholesterol check.** / Every 5 years beginning at age 25 years.  Lung cancer screening. / Every year if you are aged 11-80 years and have a 30-pack-year history of smoking and currently smoke or have quit within the past 15 years. Yearly screening is stopped once you have quit smoking for at least 15 years or develop a health problem that would prevent you from having lung cancer treatment.  Clinical breast exam.** / Every year after age 48 years.  BRCA-related cancer risk assessment.** / For women who have family members with a BRCA-related cancer (breast, ovarian, tubal, or peritoneal cancers).  Mammogram.** / Every year beginning at age 41 years and continuing for as long as you are in good health. Consult with your health care provider.  Pap test.** / Every 3 years starting at age 65 years through age 37 or 70 years with a history of 3 consecutive normal Pap tests.  HPV screening.** / Every 3 years from ages 72 years through ages 60 to 40 years with a history of 3 consecutive normal Pap tests.  Fecal occult blood test (FOBT) of stool. / Every year beginning at age 21 years and continuing until age 5 years. You may not need to do this test if you get  a colonoscopy every 10 years.  Flexible sigmoidoscopy or colonoscopy.** / Every 5 years for a flexible sigmoidoscopy or every 10 years for a colonoscopy beginning at age 35 years and continuing until age 48 years.  Hepatitis C blood test.** / For all people born from [birth year] through 1965 and any individual with known risks for hepatitis C.  Skin self-exam. / Monthly.  Influenza vaccine. / Every year.  Tetanus, diphtheria, and acellular pertussis (Tdap/Td) vaccine.** / Consult your health care provider. Pregnant women should receive 1 dose of Tdap vaccine during each pregnancy. 1 dose of Td every 10 years.  Varicella vaccine.** / Consult your health care provider. Pregnant females who do not have evidence of immunity should receive the first dose after pregnancy.  Zoster vaccine.** / 1 dose for adults aged 30 years or older.  Measles, mumps, rubella (MMR) vaccine.** / You need at least 1 dose of MMR if you were born in 1957 or later. You may also need a second dose. For females of childbearing age, rubella immunity should be determined. If there is no evidence of immunity, females who are not pregnant should be vaccinated. If there is no evidence of immunity, females who are pregnant should delay immunization until after pregnancy.  Pneumococcal 13-valent conjugate (PCV13) vaccine.** / Consult your health care provider.  Pneumococcal polysaccharide (PPSV23) vaccine.** / 1 to 2 doses if you smoke cigarettes or if you have certain conditions.  Meningococcal vaccine.** /  Consult your health care provider.  Hepatitis A vaccine.** / Consult your health care provider.  Hepatitis B vaccine.** / Consult your health care provider.  Haemophilus influenzae type b (Hib) vaccine.** / Consult your health care provider. Ages 64 years and over  Blood pressure check.** / Every year.  Lipid and cholesterol check.** / Every 5 years beginning at age 23 years.  Lung cancer screening. / Every year if you  are aged 16-80 years and have a 30-pack-year history of smoking and currently smoke or have quit within the past 15 years. Yearly screening is stopped once you have quit smoking for at least 15 years or develop a health problem that would prevent you from having lung cancer treatment.  Clinical breast exam.** / Every year after age 74 years.  BRCA-related cancer risk assessment.** / For women who have family members with a BRCA-related cancer (breast, ovarian, tubal, or peritoneal cancers).  Mammogram.** / Every year beginning at age 44 years and continuing for as long as you are in good health. Consult with your health care provider.  Pap test.** / Every 3 years starting at age 58 years through age 22 or 39 years with 3 consecutive normal Pap tests. Testing can be stopped between 65 and 70 years with 3 consecutive normal Pap tests and no abnormal Pap or HPV tests in the past 10 years.  HPV screening.** / Every 3 years from ages 64 years through ages 70 or 61 years with a history of 3 consecutive normal Pap tests. Testing can be stopped between 65 and 70 years with 3 consecutive normal Pap tests and no abnormal Pap or HPV tests in the past 10 years.  Fecal occult blood test (FOBT) of stool. / Every year beginning at age 40 years and continuing until age 27 years. You may not need to do this test if you get a colonoscopy every 10 years.  Flexible sigmoidoscopy or colonoscopy.** / Every 5 years for a flexible sigmoidoscopy or every 10 years for a colonoscopy beginning at age 7 years and continuing until age 32 years.  Hepatitis C blood test.** / For all people born from [birth year] through 1964 and any individual with known risks for hepatitis C.  Osteoporosis screening.** / A one-time screening for women ages 30 years and over and women at risk for fractures or osteoporosis.  Skin self-exam. / Monthly.  Influenza vaccine. / Every year.  Tetanus, diphtheria, and acellular pertussis (Tdap/Td)  vaccine.** / 1 dose of Td every 10 years.  Varicella vaccine.** / Consult your health care provider.  Zoster vaccine.** / 1 dose for adults aged 35 years or older.  Pneumococcal 13-valent conjugate (PCV13) vaccine.** / Consult your health care provider.  Pneumococcal polysaccharide (PPSV23) vaccine.** / 1 dose for all adults aged 46 years and older.  Meningococcal vaccine.** / Consult your health care provider.  Hepatitis A vaccine.** / Consult your health care provider.  Hepatitis B vaccine.** / Consult your health care provider.  Haemophilus influenzae type b (Hib) vaccine.** / Consult your health care provider. ** Family history and personal history of risk and conditions may change your health care provider's recommendations.   This information is not intended to replace advice given to you by your health care provider. Make sure you discuss any questions you have with your health care provider.   Document Released: 09/07/2001 Document Revised: 08/02/2014 Document Reviewed: 12/07/2010 Elsevier Interactive Patient Education [employer].

## 2016-05-20 ENCOUNTER — Telehealth: Payer: Self-pay | Admitting: Family Medicine

## 2016-05-20 DIAGNOSIS — Z3044 Encounter for surveillance of vaginal ring hormonal contraceptive device: Secondary | ICD-10-CM

## 2016-05-20 NOTE — Telephone Encounter (Signed)
Relation to NW:GNFApt:self Call back number:[phone] Pharmacy: [facility] [id number] - [place], [place] - [address]  Reason for call:  Patient states her etonogestrel-ethinyl estradiol (NUVARING) 0.12-0.015 MG/24HR vaginal ring fell out and she's fine requesting a new rx, please advise.

## 2016-05-21 MED ORDER — ETONOGESTREL-ETHINYL ESTRADIOL 0.12-0.015 MG/24HR VA RING: VAGINAL_RING | VAGINAL | 3 refills | Status: DC

## 2016-05-21 NOTE — Telephone Encounter (Signed)
[facility]ication [facility] to [facility]

## 2016-06-06 ENCOUNTER — Other Ambulatory Visit: Payer: Self-pay | Admitting: Family Medicine

## 2016-06-06 DIAGNOSIS — Z3040 Encounter for surveillance of contraceptives, unspecified: Secondary | ICD-10-CM

## 2016-09-27 ENCOUNTER — Other Ambulatory Visit: Payer: Self-pay | Admitting: Family Medicine

## 2016-09-27 DIAGNOSIS — Z3044 Encounter for surveillance of vaginal ring hormonal contraceptive device: Secondary | ICD-10-CM

## 2017-03-04 ENCOUNTER — Encounter: Payer: Self-pay | Admitting: Family Medicine

## 2017-03-04 DIAGNOSIS — Z3044 Encounter for surveillance of vaginal ring hormonal contraceptive device: Secondary | ICD-10-CM

## 2017-03-04 MED ORDER — ETONOGESTREL-ETHINYL ESTRADIOL 0.12-0.015 MG/24HR VA RING: VAGINAL_RING | VAGINAL | 0 refills | Status: DC

## 2017-03-04 NOTE — Addendum Note (Signed)
Addended by: Scharlene GlossEWING, ROBIN B on: 03/04/2017 01:15 PM   Modules accepted: Orders

## 2017-03-11 ENCOUNTER — Other Ambulatory Visit: Payer: Self-pay | Admitting: Family Medicine

## 2017-03-11 DIAGNOSIS — Z3044 Encounter for surveillance of vaginal ring hormonal contraceptive device: Secondary | ICD-10-CM

## 2017-03-11 MED ORDER — ETONOGESTREL-ETHINYL ESTRADIOL 0.12-0.015 MG/24HR VA RING: VAGINAL_RING | VAGINAL | 0 refills | Status: DC

## 2017-03-11 NOTE — Telephone Encounter (Signed)
Pt says that she have to have a 90 day supply so that her insurance will cover.   Please assist further

## 2017-03-29 ENCOUNTER — Ambulatory Visit (INDEPENDENT_AMBULATORY_CARE_PROVIDER_SITE_OTHER): Payer: BLUE CROSS/BLUE SHIELD | Admitting: Family Medicine

## 2017-03-29 ENCOUNTER — Encounter: Payer: Self-pay | Admitting: Family Medicine

## 2017-03-29 ENCOUNTER — Other Ambulatory Visit: Payer: Self-pay | Admitting: Family Medicine

## 2017-03-29 VITALS — BP 160/90 | HR 90 | Temp 98.5°F | Ht 65.0 in | Wt 139.0 lb

## 2017-03-29 DIAGNOSIS — I10 Essential (primary) hypertension: Secondary | ICD-10-CM | POA: Diagnosis not present

## 2017-03-29 DIAGNOSIS — Z0000 Encounter for general adult medical examination without abnormal findings: Secondary | ICD-10-CM

## 2017-03-29 DIAGNOSIS — Z1231 Encounter for screening mammogram for malignant neoplasm of breast: Secondary | ICD-10-CM

## 2017-03-29 MED ORDER — METOPROLOL SUCCINATE ER 50 MG PO TB24: 50.0000 mg | ORAL_TABLET | Freq: Every day | ORAL | 2 refills | Status: DC

## 2017-03-29 NOTE — Progress Notes (Signed)
Subjective:     Margaret Gibson is a 51 y.o. female and is here for a comprehensive physical exam. The patient reports problems - bp running high-- family hx .  Social History   Social History  . Marital status: Married    Spouse name: N/A  . Number of children: N/A  . Years of education: N/A   Occupational History  .  [employer]   Social History Main Topics  . Smoking status: Never Smoker  . Smokeless tobacco: Never Used  . Alcohol use No  . Drug use: No  . Sexual activity: Yes   Other Topics Concern  . Not on file   Social History Narrative   Exercise -- pedometer 10.000 steps   Health Maintenance  Topic Date Due  . HIV Screening  03/25/1982  . MAMMOGRAM  09/29/2016  . INFLUENZA VACCINE  02/23/2017  . COLONOSCOPY  03/25/2017  . PAP SMEAR  01/01/2018  . TETANUS/TDAP  11/06/2023    The following portions of the patient's history were reviewed and updated as appropriate:  She  has no past medical history on file. She  does not have any pertinent problems on file. She  has a past surgical history that includes Leg Surgery (08/2008). Her family history includes Cancer (age of onset: 41) in her sister; Hyperlipidemia in her mother; Hypertension in her father. She  reports that she has never smoked. She has never used smokeless tobacco. She reports that she does not drink alcohol or use drugs. She has a current medication list which includes the following prescription(s): etonogestrel-ethinyl estradiol, flaxseed (linseed), garlic, hair/skin/nails, fish oil triple strength, and metoprolol succinate. Current Outpatient Prescriptions on File Prior to Visit  Medication Sig Dispense Refill  . etonogestrel-ethinyl estradiol (NUVARING) 0.12-0.015 MG/24HR vaginal ring Insert vaginally and leave in place for 3 consecutive weeks, then remove for 1 week. 3 each 0  . Flaxseed, Linseed, (FLAX SEED OIL PO) Take by mouth daily.      Marland Kitchen GARLIC PO Take by mouth.    . Multiple  Vitamins-Minerals (HAIR/SKIN/NAILS) TABS Take by mouth daily.      . Omega-3 Fatty Acids (FISH OIL TRIPLE STRENGTH) 1400 MG CAPS Take by mouth daily.       No current facility-administered medications on file prior to visit.    She has No Known Allergies..  Review of Systems Review of Systems  Constitutional: Negative for activity change, appetite change and fatigue.  HENT: Negative for hearing loss, congestion, tinnitus and ear discharge.  dentist q80m Eyes: Negative for visual disturbance (see optho q1y -- vision corrected to 20/20 with glasses).  Respiratory: Negative for cough, chest tightness and shortness of breath.   Cardiovascular: Negative for chest pain, palpitations and leg swelling.  Gastrointestinal: Negative for abdominal pain, diarrhea, constipation and abdominal distention.  Genitourinary: Negative for urgency, frequency, decreased urine volume and difficulty urinating.  Musculoskeletal: Negative for back pain, arthralgias and gait problem.  Skin: Negative for color change, pallor and rash.  Neurological: Negative for dizziness, light-headedness, numbness and headaches.  Hematological: Negative for adenopathy. Does not bruise/bleed easily.  Psychiatric/Behavioral: Negative for suicidal ideas, confusion, sleep disturbance, self-injury, dysphoric mood, decreased concentration and agitation.       Objective:    BP (!) 160/90 (BP Location: Left Arm, Cuff Size: Normal) Comment: This is a recheck, previous tap shows first reading  Pulse 90   Temp 98.5 F (36.9 C) (Oral)   Ht 5\' 5"  (1.651 m)  Wt 139 lb (63 kg)   LMP 03/22/2017   SpO2 98%   BMI 23.13 kg/m  General appearance: alert, cooperative, appears stated age and no distress Head: Normocephalic, without obvious abnormality, atraumatic Eyes: conjunctivae/corneas clear. PERRL, EOM's intact. Fundi benign. Ears: normal TM's and external ear canals both ears Nose: Nares normal. Septum midline. Mucosa normal. No  drainage or sinus tenderness. Throat: lips, mucosa, and tongue normal; teeth and gums normal Neck: no adenopathy, no carotid bruit, no JVD, supple, symmetrical, trachea midline and thyroid not enlarged, symmetric, no tenderness/mass/nodules Back: symmetric, no curvature. ROM normal. No CVA tenderness. Lungs: clear to auscultation bilaterally Breasts: normal appearance, no masses or tenderness Heart: regular rate and rhythm, S1, S2 normal, no murmur, click, rub or gallop Abdomen: soft, non-tender; bowel sounds normal; no masses,  no organomegaly Pelvic: deferred Extremities: extremities normal, atraumatic, no cyanosis or edema Pulses: 2+ and symmetric Skin: Skin color, texture, turgor normal. No rashes or lesions Lymph nodes: Cervical, supraclavicular, and axillary nodes normal. Neurologic: Alert and oriented X 3, normal strength and tone. Normal symmetric reflexes. Normal coordination and gait    Assessment:    Healthy female exam.      Plan:    ghm utd Check labs See After Visit Summary for Counseling Recommendations   Pt will schedule mammogram downstairs She will talk to pharmacy about shingrix  She refused flu shot 1. Essential hypertension Poorly controlled will alter medications, encouraged DASH diet, minimize caffeine and obtain adequate sleep. Report concerning symptoms and follow up as directed and as needed - metoprolol succinate (TOPROL XL) 50 MG 24 hr tablet; Take 1 tablet (50 mg total) by mouth daily. Take with or immediately following a meal.  Dispense: 30 tablet; Refill: 2

## 2017-03-29 NOTE — Patient Instructions (Signed)
Preventive Care 40-64 Years, Female Preventive care refers to lifestyle choices and visits with your health care provider that can promote health and wellness. What does preventive care include?  A yearly physical exam. This is also called an annual well check.  Dental exams once or twice a year.  Routine eye exams. Ask your health care provider how often you should have your eyes checked.  Personal lifestyle choices, including: ? Daily care of your teeth and gums. ? Regular physical activity. ? Eating a healthy diet. ? Avoiding tobacco and drug use. ? Limiting alcohol use. ? Practicing safe sex. ? Taking low-dose aspirin daily starting at age 58. ? Taking vitamin and mineral supplements as recommended by your health care provider. What happens during an annual well check? The services and screenings done by your health care provider during your annual well check will depend on your age, overall health, lifestyle risk factors, and family history of disease. Counseling Your health care provider may ask you questions about your:  Alcohol use.  Tobacco use.  Drug use.  Emotional well-being.  Home and relationship well-being.  Sexual activity.  Eating habits.  Work and work Statistician.  Method of birth control.  Menstrual cycle.  Pregnancy history.  Screening You may have the following tests or measurements:  Height, weight, and BMI.  Blood pressure.  Lipid and cholesterol levels. These may be checked every 5 years, or more frequently if you are over 81 years old.  Skin check.  Lung cancer screening. You may have this screening every year starting at age 78 if you have a 30-pack-year history of smoking and currently smoke or have quit within the past 15 years.  Fecal occult blood test (FOBT) of the stool. You may have this test every year starting at age 65.  Flexible sigmoidoscopy or colonoscopy. You may have a sigmoidoscopy every 5 years or a colonoscopy  every 10 years starting at age 30.  Hepatitis C blood test.  Hepatitis B blood test.  Sexually transmitted disease (STD) testing.  Diabetes screening. This is done by checking your blood sugar (glucose) after you have not eaten for a while (fasting). You may have this done every 1-3 years.  Mammogram. This may be done every 1-2 years. Talk to your health care provider about when you should start having regular mammograms. This may depend on whether you have a family history of breast cancer.  BRCA-related cancer screening. This may be done if you have a family history of breast, ovarian, tubal, or peritoneal cancers.  Pelvic exam and Pap test. This may be done every 3 years starting at age 80. Starting at age 36, this may be done every 5 years if you have a Pap test in combination with an HPV test.  Bone density scan. This is done to screen for osteoporosis. You may have this scan if you are at high risk for osteoporosis.  Discuss your test results, treatment options, and if necessary, the need for more tests with your health care provider. Vaccines Your health care provider may recommend certain vaccines, such as:  Influenza vaccine. This is recommended every year.  Tetanus, diphtheria, and acellular pertussis (Tdap, Td) vaccine. You may need a Td booster every 10 years.  Varicella vaccine. You may need this if you have not been vaccinated.  Zoster vaccine. You may need this after age 5.  Measles, mumps, and rubella (MMR) vaccine. You may need at least one dose of MMR if you were born in  1956 or later. You may also need a second dose.  Pneumococcal 13-valent conjugate (PCV13) vaccine. You may need this if you have certain conditions and were not previously vaccinated.  Pneumococcal polysaccharide (PPSV23) vaccine. You may need one or two doses if you smoke cigarettes or if you have certain conditions.  Meningococcal vaccine. You may need this if you have certain  conditions.  Hepatitis A vaccine. You may need this if you have certain conditions or if you travel or work in places where you may be exposed to hepatitis A.  Hepatitis B vaccine. You may need this if you have certain conditions or if you travel or work in places where you may be exposed to hepatitis B.  Haemophilus influenzae type b (Hib) vaccine. You may need this if you have certain conditions.  Talk to your health care provider about which screenings and vaccines you need and how often you need them. This information is not intended to replace advice given to you by your health care provider. Make sure you discuss any questions you have with your health care provider. Document Released: 08/08/2015 Document Revised: 03/31/2016 Document Reviewed: 05/13/2015 Elsevier Interactive Patient Education  2017 [employer].

## 2017-03-30 ENCOUNTER — Other Ambulatory Visit (INDEPENDENT_AMBULATORY_CARE_PROVIDER_SITE_OTHER): Payer: BLUE CROSS/BLUE SHIELD

## 2017-03-30 DIAGNOSIS — I10 Essential (primary) hypertension: Secondary | ICD-10-CM | POA: Diagnosis not present

## 2017-03-30 DIAGNOSIS — Z0000 Encounter for general adult medical examination without abnormal findings: Secondary | ICD-10-CM

## 2017-03-30 LAB — POC URINALSYSI DIPSTICK (AUTOMATED)
Bilirubin, UA: NEGATIVE
Blood, UA: NEGATIVE
GLUCOSE UA: NEGATIVE
KETONES UA: NEGATIVE
Leukocytes, UA: NEGATIVE
Nitrite, UA: NEGATIVE
Protein, UA: NEGATIVE
SPEC GRAV UA: 1.01 (ref 1.010–1.025)
Urobilinogen, UA: 0.2 E.U./dL
pH, UA: 6.5 (ref 5.0–8.0)

## 2017-03-30 LAB — CBC WITH DIFFERENTIAL/PLATELET
BASOS ABS: 0.1 10*3/uL (ref 0.0–0.1)
BASOS PCT: 1.3 % (ref 0.0–3.0)
Eosinophils Absolute: 0.1 10*3/uL (ref 0.0–0.7)
Eosinophils Relative: 1.1 % (ref 0.0–5.0)
HEMATOCRIT: 37.8 % (ref 36.0–46.0)
Hemoglobin: 12.1 g/dL (ref 12.0–15.0)
LYMPHS PCT: 28.1 % (ref 12.0–46.0)
Lymphs Abs: 1.7 10*3/uL (ref 0.7–4.0)
MCHC: 32 g/dL (ref 30.0–36.0)
MCV: 85.5 fl (ref 78.0–100.0)
MONOS PCT: 7.8 % (ref 3.0–12.0)
Monocytes Absolute: 0.5 10*3/uL (ref 0.1–1.0)
Neutro Abs: 3.8 10*3/uL (ref 1.4–7.7)
Neutrophils Relative %: 61.7 % (ref 43.0–77.0)
PLATELETS: 206 10*3/uL (ref 150.0–400.0)
RBC: 4.42 Mil/uL (ref 3.87–5.11)
RDW: 12.5 % (ref 11.5–15.5)
WBC: 6.2 10*3/uL (ref 4.0–10.5)

## 2017-03-30 LAB — COMPREHENSIVE METABOLIC PANEL
ALT: 18 U/L (ref 0–35)
AST: 30 U/L (ref 0–37)
Albumin: 4.3 g/dL (ref 3.5–5.2)
Alkaline Phosphatase: 46 U/L (ref 39–117)
BUN: 11 mg/dL (ref 6–23)
CHLORIDE: 101 meq/L (ref 96–112)
CO2: 24 meq/L (ref 19–32)
CREATININE: 0.94 mg/dL (ref 0.40–1.20)
Calcium: 9.9 mg/dL (ref 8.4–10.5)
GFR: 66.99 mL/min (ref >60.00)
GLUCOSE: 85 mg/dL (ref 70–99)
Potassium: 4.2 mEq/L (ref 3.5–5.1)
SODIUM: 136 meq/L (ref 135–145)
Total Bilirubin: 0.6 mg/dL (ref 0.2–1.2)
Total Protein: 8 g/dL (ref 6.0–8.3)

## 2017-03-30 LAB — LIPID PANEL
CHOL/HDL RATIO: 3
Cholesterol: 243 mg/dL — ABNORMAL HIGH (ref 0–200)
HDL: 75.9 mg/dL (ref >39.00)
LDL Cholesterol: 151 mg/dL — ABNORMAL HIGH (ref 0–99)
NONHDL: 166.67
Triglycerides: 77 mg/dL (ref 0.0–149.0)
VLDL: 15.4 mg/dL (ref 0.0–40.0)

## 2017-03-30 LAB — TSH: TSH: 1.76 u[IU]/mL (ref 0.35–4.50)

## 2017-04-04 ENCOUNTER — Encounter (HOSPITAL_BASED_OUTPATIENT_CLINIC_OR_DEPARTMENT_OTHER): Payer: Self-pay

## 2017-04-04 ENCOUNTER — Ambulatory Visit (HOSPITAL_BASED_OUTPATIENT_CLINIC_OR_DEPARTMENT_OTHER)
Admission: RE | Admit: 2017-04-04 | Discharge: 2017-04-04 | Disposition: A | Discharge status: Home / Self Care | Payer: BLUE CROSS/BLUE SHIELD | Source: Ambulatory Visit | Attending: Family Medicine | Admitting: Family Medicine

## 2017-04-04 DIAGNOSIS — Z1231 Encounter for screening mammogram for malignant neoplasm of breast: Secondary | ICD-10-CM | POA: Insufficient documentation

## 2017-04-11 ENCOUNTER — Encounter: Payer: Self-pay | Admitting: Family Medicine

## 2017-04-11 NOTE — Telephone Encounter (Signed)
TB-Would you please reach out to this patient to schedule a nurse visit for BP check? Plz advise/thx dmf

## 2017-04-21 ENCOUNTER — Encounter: Payer: Self-pay | Admitting: Family Medicine

## 2017-04-21 ENCOUNTER — Ambulatory Visit (INDEPENDENT_AMBULATORY_CARE_PROVIDER_SITE_OTHER): Payer: BLUE CROSS/BLUE SHIELD | Admitting: Family Medicine

## 2017-04-21 VITALS — BP 178/95 | HR 76

## 2017-04-21 DIAGNOSIS — I10 Essential (primary) hypertension: Secondary | ICD-10-CM

## 2017-04-21 MED ORDER — METOPROLOL SUCCINATE ER 100 MG PO TB24: 100.0000 mg | ORAL_TABLET | Freq: Every day | ORAL | 1 refills | Status: DC

## 2017-04-21 NOTE — Progress Notes (Signed)
Pre visit review using our clinic review tool, if applicable. No additional management support is needed unless otherwise documented below in the visit note.  Patient presents in office for blood pressure check per OV note 03/29/17. She voiced that since the last visit she has missed two doses of Metoprolol, but states consistency with medication this week. Today's readings were as follow: BP 194/96 P 86 & BP 178/95 P 76. Patient denies headaches, dizziness & lightheadedness.  Per Dr. Zola Button: Increase Metoprolol Succinate (Toprol-XL) to 100 MG once daily. Follow-up with PCP in 2-3 weeks.  Informed patient of the provider's recommendations. She verbalized understanding & will call and schedule the next appointment at a later date.

## 2017-04-21 NOTE — Patient Instructions (Signed)
Per Dr. Zola Button: Increase Metoprolol Succinate (Toprol-XL) to 100 MG once daily. Follow-up with PCP in 2-3 weeks.

## 2017-04-21 NOTE — Progress Notes (Signed)
Reviewed  Yvonne R Lowne Chase, DO  

## 2017-04-28 ENCOUNTER — Encounter: Payer: Self-pay | Admitting: Family Medicine

## 2017-05-20 ENCOUNTER — Ambulatory Visit: Payer: BLUE CROSS/BLUE SHIELD | Admitting: Family Medicine

## 2017-05-20 ENCOUNTER — Ambulatory Visit: Payer: BLUE CROSS/BLUE SHIELD

## 2017-05-20 ENCOUNTER — Ambulatory Visit (INDEPENDENT_AMBULATORY_CARE_PROVIDER_SITE_OTHER): Payer: BLUE CROSS/BLUE SHIELD | Admitting: Family Medicine

## 2017-05-20 ENCOUNTER — Encounter: Payer: Self-pay | Admitting: Family Medicine

## 2017-05-20 VITALS — BP 230/110 | HR 80 | Temp 98.7°F | Ht 65.0 in | Wt 141.0 lb

## 2017-05-20 DIAGNOSIS — I10 Essential (primary) hypertension: Secondary | ICD-10-CM

## 2017-05-20 MED ORDER — AMLODIPINE BESYLATE 5 MG PO TABS: 5.0000 mg | ORAL_TABLET | Freq: Every day | ORAL | 3 refills | Status: DC

## 2017-05-20 NOTE — Patient Instructions (Signed)

## 2017-05-20 NOTE — Progress Notes (Signed)
Patient ID: Margaret Gibson, female    DOB: Feb 13, 1966  Age: 51 y.o. MRN: [id number]    Subjective:  Subjective  HPI Margaret Gibson presents for bp f/u --- bp uncontrolled.    Review of Systems  Constitutional: Negative for appetite change, diaphoresis, fatigue and unexpected weight change.  Eyes: Negative for pain, redness and visual disturbance.  Respiratory: Negative for cough, chest tightness, shortness of breath and wheezing.   Cardiovascular: Negative for chest pain, palpitations and leg swelling.  Endocrine: Negative for cold intolerance, heat intolerance, polydipsia, polyphagia and polyuria.  Genitourinary: Negative for difficulty urinating, dysuria and frequency.  Neurological: Negative for dizziness, light-headedness, numbness and headaches.    History No past medical history on file.  She has a past surgical history that includes Leg Surgery (08/2008).   Her family history includes Cancer (age of onset: 31) in her sister; Hyperlipidemia in her mother; Hypertension in her father.She reports that she has never smoked. She has never used smokeless tobacco. She reports that she does not drink alcohol or use drugs.  Current Outpatient Prescriptions on File Prior to Visit  Medication Sig Dispense Refill  . etonogestrel-ethinyl estradiol (NUVARING) 0.12-0.015 MG/24HR vaginal ring Insert vaginally and leave in place for 3 consecutive weeks, then remove for 1 week. 3 each 0  . Flaxseed, Linseed, (FLAX SEED OIL PO) Take by mouth daily.      Marland Kitchen GARLIC PO Take by mouth.    . metoprolol succinate (TOPROL-XL) 100 MG 24 hr tablet Take 1 tablet (100 mg total) by mouth daily. Take with or immediately following a meal. 30 tablet 1  . Multiple Vitamins-Minerals (HAIR/SKIN/NAILS) TABS Take by mouth daily.      . Omega-3 Fatty Acids (FISH OIL TRIPLE STRENGTH) 1400 MG CAPS Take by mouth daily.       No current facility-administered medications on file prior to visit.      Objective:  Objective   Physical Exam  Constitutional: She is oriented to person, place, and time. She appears well-developed and well-nourished.  HENT:  Head: Normocephalic and atraumatic.  Eyes: Conjunctivae and EOM are normal.  Neck: Normal range of motion. Neck supple. No JVD present. Carotid bruit is not present. No thyromegaly present.  Cardiovascular: Normal rate, regular rhythm and normal heart sounds.   No murmur heard. Pulmonary/Chest: Effort normal and breath sounds normal. No respiratory distress. She has no wheezes. She has no rales. She exhibits no tenderness.  Musculoskeletal: She exhibits no edema.  Neurological: She is alert and oriented to person, place, and time.  Psychiatric: She has a normal mood and affect.  Nursing note and vitals reviewed.  BP (!) 230/110   Pulse 80   Temp 98.7 F (37.1 C) (Oral)   Ht 5\' 5"  (1.651 m)   Wt 141 lb (64 kg)   SpO2 98%   BMI 23.46 kg/m  Wt Readings from Last 3 Encounters:  05/20/17 141 lb (64 kg)  03/29/17 139 lb (63 kg)  02/02/16 135 lb 12.8 oz (61.6 kg)     Lab Results  Component Value Date   WBC 6.2 03/30/2017   HGB 12.1 03/30/2017   HCT 37.8 03/30/2017   PLT 206.0 03/30/2017   GLUCOSE 85 03/30/2017   CHOL 243 (H) 03/30/2017   TRIG 77.0 03/30/2017   HDL 75.90 03/30/2017   LDLDIRECT 136.5 10/31/2012   LDLCALC 151 (H) 03/30/2017   ALT 18 03/30/2017   AST 30 03/30/2017   NA 136 03/30/2017   K 4.2 03/30/2017  CL 101 03/30/2017   CREATININE 0.94 03/30/2017   BUN 11 03/30/2017   CO2 24 03/30/2017   TSH 1.76 03/30/2017    Mm Digital Screening Bilateral  Result Date: 04/04/2017 CLINICAL DATA:  Screening. EXAM: DIGITAL SCREENING BILATERAL MAMMOGRAM WITH CAD COMPARISON:  None. ACR Breast Density Category c: The breast tissue is heterogeneously dense, which may obscure small masses FINDINGS: There are no findings suspicious for malignancy. Images were processed with CAD. IMPRESSION: No mammographic evidence of malignancy. A result letter  of this screening mammogram will be mailed directly to the patient. RECOMMENDATION: Screening mammogram in one year. (Code:SM-B-01Y) BI-RADS CATEGORY  1: Negative. Electronically Signed   By: Elberta Fortisaniel  Boyle M.D.   On: 04/04/2017 17:18     Assessment & Plan:  Plan  I am having Ms. Korinek start on amLODipine. I am also having her maintain her FISH OIL TRIPLE STRENGTH, (Flaxseed, Linseed, (FLAX SEED OIL PO)), HAIR/SKIN/NAILS, GARLIC PO, etonogestrel-ethinyl estradiol, and metoprolol succinate.  Meds ordered this encounter  Medications  . amLODipine (NORVASC) 5 MG tablet    Sig: Take 1 tablet (5 mg total) by mouth daily.    Dispense:  90 tablet    Refill:  3    Problem List Items Addressed This Visit      Unprioritized   Hypertension - Primary    Poorly controlled will alter medications, encouraged DASH diet, minimize caffeine and obtain adequate sleep. Report concerning symptoms and follow up as directed and as needed con't metoprolol Add norvasc Check echo Pt will look into getting a bp cuff rto 2-3 weeks nurse visit       Relevant Medications   amLODipine (NORVASC) 5 MG tablet   Other Relevant Orders   EKG 12-Lead (Completed)   ECHOCARDIOGRAM COMPLETE      Follow-up: Return in about 3 weeks (around 06/10/2017), or if symptoms worsen or fail to improve.  Donato SchultzYvonne R Lowne Chase, DO

## 2017-05-20 NOTE — Assessment & Plan Note (Signed)
Poorly controlled will alter medications, encouraged DASH diet, minimize caffeine and obtain adequate sleep. Report concerning symptoms and follow up as directed and as needed con't metoprolol Add norvasc Check echo Pt will look into getting a bp cuff rto 2-3 weeks nurse visit

## 2017-05-27 ENCOUNTER — Telehealth: Payer: Self-pay | Admitting: Family Medicine

## 2017-05-27 DIAGNOSIS — Z3044 Encounter for surveillance of vaginal ring hormonal contraceptive device: Secondary | ICD-10-CM

## 2017-05-27 MED ORDER — ETONOGESTREL-ETHINYL ESTRADIOL 0.12-0.015 MG/24HR VA RING: VAGINAL_RING | VAGINAL | 1 refills | Status: DC

## 2017-05-27 NOTE — Telephone Encounter (Signed)
Patient notified and rx sent. 

## 2017-05-27 NOTE — Telephone Encounter (Signed)
Pt request NUVARING called in to [facility] on [address]

## 2017-06-09 ENCOUNTER — Other Ambulatory Visit: Payer: Self-pay | Admitting: Family Medicine

## 2017-06-09 DIAGNOSIS — Z3044 Encounter for surveillance of vaginal ring hormonal contraceptive device: Secondary | ICD-10-CM

## 2017-06-14 ENCOUNTER — Ambulatory Visit (INDEPENDENT_AMBULATORY_CARE_PROVIDER_SITE_OTHER): Payer: BLUE CROSS/BLUE SHIELD | Admitting: Family Medicine

## 2017-06-14 VITALS — BP 150/86 | HR 86

## 2017-06-14 DIAGNOSIS — I10 Essential (primary) hypertension: Secondary | ICD-10-CM

## 2017-06-14 NOTE — Progress Notes (Signed)
Yvonne R Lowne Chase, DO 

## 2017-06-14 NOTE — Progress Notes (Signed)
Pre visit review using our clinic tool,if applicable. No additional management support is needed unless otherwise documented below in the visit note.   Patient in for BP check per order dated 05/20/17 by Dr. Marya FossaY. Lowne-Chase.  Patient states she has been taking BP with her home monitor and it has been running 124/74. Patient did not bring in readings. States she has taken her daytime dose of BP medication. Patient insists that her BP becomes elevated when she is in the office.  BP today  Manually = 150/86 P = 86  Per Dr. Laury AxonLowne- Chase patient to increase Amlodipine from 5 mg to 10 mg and continue taking with Metoprolol 100 mg daily. Patient to return for BP follow up with provider in 2 weeks.  Patient states  her BP is not usually that high and she does not feel she needs increase in Amlodepine. States she will decide if she wants to return for office visit with provider. Advised patient to bring cuff in on next visit and record BP readings for review by provider.

## 2017-06-15 ENCOUNTER — Other Ambulatory Visit (HOSPITAL_BASED_OUTPATIENT_CLINIC_OR_DEPARTMENT_OTHER): Payer: BLUE CROSS/BLUE SHIELD

## 2017-06-15 ENCOUNTER — Ambulatory Visit (HOSPITAL_BASED_OUTPATIENT_CLINIC_OR_DEPARTMENT_OTHER)
Admission: RE | Admit: 2017-06-15 | Discharge: 2017-06-15 | Disposition: A | Discharge status: Home / Self Care | Payer: BLUE CROSS/BLUE SHIELD | Source: Ambulatory Visit | Attending: Family Medicine | Admitting: Family Medicine

## 2017-06-15 DIAGNOSIS — I10 Essential (primary) hypertension: Secondary | ICD-10-CM | POA: Diagnosis not present

## 2017-06-15 NOTE — Progress Notes (Signed)
Echocardiogram 2D Echocardiogram has been performed.  Margaret Gibson, Margaret Gibson 06/15/2017, 1:49 PM

## 2017-06-18 ENCOUNTER — Other Ambulatory Visit: Payer: Self-pay | Admitting: Family Medicine

## 2017-06-27 ENCOUNTER — Encounter: Payer: Self-pay | Admitting: Family Medicine

## 2017-06-27 ENCOUNTER — Ambulatory Visit: Payer: BLUE CROSS/BLUE SHIELD | Admitting: Family Medicine

## 2017-06-27 VITALS — BP 138/88 | HR 81 | Temp 97.9°F | Ht 65.0 in

## 2017-06-27 DIAGNOSIS — I10 Essential (primary) hypertension: Secondary | ICD-10-CM

## 2017-06-27 MED ORDER — METOPROLOL SUCCINATE ER 100 MG PO TB24: ORAL_TABLET | ORAL | 1 refills | Status: DC

## 2017-06-27 MED ORDER — AMLODIPINE BESYLATE 10 MG PO TABS: 10.0000 mg | ORAL_TABLET | Freq: Every day | ORAL | 3 refills | Status: DC

## 2017-06-27 NOTE — Patient Instructions (Signed)

## 2017-06-27 NOTE — Progress Notes (Signed)
Patient ID: Margaret Gibson   DOB: [birth year], 51 y.o.   MRN: [id number]     Subjective:  I acted as a Neurosurgeonscribe for Dr. Zola ButtonLowne-Chase.  Margaret Gibson, CMA   Patient ID: Margaret Gibson    DOB: [birth year], 51 y.o.   MRN: [id number]  Chief Complaint  Patient presents with  . Hypertension    HPI  Patient is in today for follow up blood pressure.  She has checked outside of the office and blood pressures have been fine.  Patient Care Team: Zola ButtonLowne Chase, Grayling CongressYvonne R, DO as PCP - General (Family Medicine)   No past medical history on file.  Past Surgical History:  Procedure Laterality Date  . LEG SURGERY  08/2008   rod implant    Family History  Problem Relation Age of Onset  . Hyperlipidemia Mother   . Hypertension Father   . Cancer Sister [id number]       breast    Social History   Socioeconomic History  . Marital status: Married    Spouse name: Not on file  . Number of children: Not on file  . Years of education: Not on file  . Highest education level: Not on file  Social Needs  . Financial resource strain: Not on file  . Food insecurity - worry: Not on file  . Food insecurity - inability: Not on file  . Transportation needs - medical: Not on file  . Transportation needs - non-medical: Not on file  Occupational History    Employer: [employer]    Comment: [employer]  Tobacco Use  . Smoking status: Never Smoker  . Smokeless tobacco: Never Used  Substance and Sexual Activity  . Alcohol use: No  . Drug use: No  . Sexual activity: Yes  Other Topics Concern  . Not on file  Social History Narrative   Exercise -- pedometer 10.000 steps    Outpatient Medications Prior to Visit  Medication Sig Dispense Refill  . etonogestrel-ethinyl estradiol (NUVARING) 0.12-0.015 MG/24HR vaginal ring UNWRAP AND INSERT VAGINALLY AND LEAVE IN PLACE FOR 3 CONSECUTIVE WEEKS, THEN REMOVE FOR 1 WEEK 3 each 0  . Flaxseed, Linseed, (FLAX SEED OIL PO) Take by mouth daily.      Marland Kitchen. GARLIC PO Take  by mouth.    . Multiple Vitamins-Minerals (HAIR/SKIN/NAILS) TABS Take by mouth daily.      . Omega-3 Fatty Acids (FISH OIL TRIPLE STRENGTH) 1400 MG CAPS Take by mouth daily.      . metoprolol succinate (TOPROL-XL) 100 MG 24 hr tablet TAKE 1 TABLET BY MOUTH DAILY WITH OR IMMEDIATELY FOLLOWING A MEAL 30 tablet 0  . etonogestrel-ethinyl estradiol (NUVARING) 0.12-0.015 MG/24HR vaginal ring Insert vaginally and leave in place for 3 consecutive weeks, then remove for 1 week. 3 each 1   No facility-administered medications prior to visit.     No Known Allergies  Review of Systems  Constitutional: Negative for fever and malaise/fatigue.  HENT: Negative for congestion.   Eyes: Negative for blurred vision.  Respiratory: Negative for cough and shortness of breath.   Cardiovascular: Negative for chest pain, palpitations and leg swelling.  Gastrointestinal: Negative for vomiting.  Musculoskeletal: Negative for back pain.  Skin: Negative for rash.  Neurological: Negative for loss of consciousness and headaches.       Objective:    Physical Exam  Constitutional: She is oriented to person, place, and time. She appears well-developed and well-nourished.  HENT:  Head: Normocephalic and atraumatic.  Eyes: Conjunctivae and EOM  are normal.  Neck: Normal range of motion. Neck supple. No JVD present. Carotid bruit is not present. No thyromegaly present.  Cardiovascular: Normal rate, regular rhythm and normal heart sounds.  No murmur heard. Pulmonary/Chest: Effort normal and breath sounds normal. No respiratory distress. She has no wheezes. She has no rales. She exhibits no tenderness.  Musculoskeletal: She exhibits no edema.  Neurological: She is alert and oriented to person, place, and time.  Psychiatric: She has a normal mood and affect. Her behavior is normal. Judgment and thought content normal.  Nursing note and vitals reviewed.   BP 138/88   Pulse 81   Temp 97.9 F (36.6 C) (Oral)   Ht 5'  5" (1.651 m)   LMP 06/13/2017   BMI 23.46 kg/m  Wt Readings from Last 3 Encounters:  05/20/17 141 lb (64 kg)  03/29/17 139 lb (63 kg)  02/02/16 135 lb 12.8 oz (61.6 kg)   BP Readings from Last 3 Encounters:  06/27/17 138/88  06/14/17 (!) 150/86  05/20/17 (!) 230/110     Immunization History  Administered Date(s) Administered  . Tdap 11/05/2013    Health Maintenance  Topic Date Due  . HIV Screening  03/25/1982  . COLONOSCOPY  03/25/2017  . INFLUENZA VACCINE  02/23/2018 (Originally 02/23/2017)  . PAP SMEAR  01/01/2018  . MAMMOGRAM  04/04/2018  . TETANUS/TDAP  11/06/2023    Lab Results  Component Value Date   WBC 6.2 03/30/2017   HGB 12.1 03/30/2017   HCT 37.8 03/30/2017   PLT 206.0 03/30/2017   GLUCOSE 85 03/30/2017   CHOL 243 (H) 03/30/2017   TRIG 77.0 03/30/2017   HDL 75.90 03/30/2017   LDLDIRECT 136.5 10/31/2012   LDLCALC 151 (H) 03/30/2017   ALT 18 03/30/2017   AST 30 03/30/2017   NA 136 03/30/2017   K 4.2 03/30/2017   CL 101 03/30/2017   CREATININE 0.94 03/30/2017   BUN 11 03/30/2017   CO2 24 03/30/2017   TSH 1.76 03/30/2017    Lab Results  Component Value Date   TSH 1.76 03/30/2017   Lab Results  Component Value Date   WBC 6.2 03/30/2017   HGB 12.1 03/30/2017   HCT 37.8 03/30/2017   MCV 85.5 03/30/2017   PLT 206.0 03/30/2017   Lab Results  Component Value Date   NA 136 03/30/2017   K 4.2 03/30/2017   CO2 24 03/30/2017   GLUCOSE 85 03/30/2017   BUN 11 03/30/2017   CREATININE 0.94 03/30/2017   BILITOT 0.6 03/30/2017   ALKPHOS 46 03/30/2017   AST 30 03/30/2017   ALT 18 03/30/2017   PROT 8.0 03/30/2017   ALBUMIN 4.3 03/30/2017   CALCIUM 9.9 03/30/2017   GFR 66.99 03/30/2017   Lab Results  Component Value Date   CHOL 243 (H) 03/30/2017   Lab Results  Component Value Date   HDL 75.90 03/30/2017   Lab Results  Component Value Date   LDLCALC 151 (H) 03/30/2017   Lab Results  Component Value Date   TRIG 77.0 03/30/2017   Lab  Results  Component Value Date   CHOLHDL 3 03/30/2017   No results found for: HGBA1C       Assessment & Plan:   Problem List Items Addressed This Visit      Unprioritized   Hypertension - Primary   Relevant Medications   amLODipine (NORVASC) 10 MG tablet   metoprolol succinate (TOPROL-XL) 100 MG 24 hr tablet     Poorly controlled will alter medications,  encouraged DASH diet, minimize caffeine and obtain adequate sleep. Report concerning symptoms and follow up as directed and as needed I have discontinued Lorely Pewitt's metoprolol succinate. I have also changed her metoprolol succinate. Additionally, I am having her start on amLODipine. Lastly, I am having her maintain her FISH OIL TRIPLE STRENGTH, (Flaxseed, Linseed, (FLAX SEED OIL PO)), HAIR/SKIN/NAILS, GARLIC PO, and etonogestrel-ethinyl estradiol.  Meds ordered this encounter  Medications  . DISCONTD: metoprolol succinate (TOPROL-XL) 100 MG 24 hr tablet    Sig: TAKE 1 TABLET BY MOUTH DAILY WITH OR IMMEDIATELY FOLLOWING A MEAL    Dispense:  90 tablet    Refill:  1  . amLODipine (NORVASC) 10 MG tablet    Sig: Take 1 tablet (10 mg total) by mouth daily.    Dispense:  90 tablet    Refill:  3  . metoprolol succinate (TOPROL-XL) 100 MG 24 hr tablet    Sig: TAKE 1 1/2 tab TABLET BY MOUTH DAILY WITH OR IMMEDIATELY FOLLOWING A MEAL    Dispense:  135 tablet    Refill:  1    CMA served as scribe during this visit. History, Physical and Plan performed by medical provider. Documentation and orders reviewed and attested to.  Donato Schultz, DO

## 2017-07-12 ENCOUNTER — Ambulatory Visit: Payer: BLUE CROSS/BLUE SHIELD

## 2017-07-15 ENCOUNTER — Ambulatory Visit (INDEPENDENT_AMBULATORY_CARE_PROVIDER_SITE_OTHER): Payer: BLUE CROSS/BLUE SHIELD | Admitting: Family Medicine

## 2017-07-15 VITALS — BP 134/89 | HR 93

## 2017-07-15 DIAGNOSIS — I10 Essential (primary) hypertension: Secondary | ICD-10-CM | POA: Diagnosis not present

## 2017-07-15 NOTE — Progress Notes (Signed)
Pre visit review using our clinic tool,if applicable. No additional management support is needed unless otherwise documented below in the visit note.   Patient in for BP check per Dr. Zola ButtonLowne-Chase.  BP last visit = 138/88 P= 81  Stattes her husband is currently hospitalized and she is under a lot of stress   Patient taking Amlodipine 10 mg daily and Metoprolol 100 mg  1.5 tablets daily.  BP today = 134/89 P= 93  Per Dr. Zola ButtonLowne-Chase patient to continue medications as ordered and return for BP check in 1 month. Patient agreed. States she will call back and schedule.

## 2017-08-26 ENCOUNTER — Other Ambulatory Visit: Payer: Self-pay | Admitting: Family Medicine

## 2017-08-26 DIAGNOSIS — Z3044 Encounter for surveillance of vaginal ring hormonal contraceptive device: Secondary | ICD-10-CM

## 2017-08-31 MED ORDER — ETONOGESTREL-ETHINYL ESTRADIOL 0.12-0.015 MG/24HR VA RING
VAGINAL_RING | VAGINAL | 1 refills | Status: DC
Start: 2017-08-31 — End: 2017-11-18

## 2017-09-19 ENCOUNTER — Encounter: Payer: Self-pay | Admitting: Family Medicine

## 2017-09-19 DIAGNOSIS — I10 Essential (primary) hypertension: Secondary | ICD-10-CM

## 2017-09-19 MED ORDER — METOPROLOL SUCCINATE ER 100 MG PO TB24: ORAL_TABLET | ORAL | 1 refills | Status: DC

## 2017-11-18 ENCOUNTER — Encounter: Payer: Self-pay | Admitting: Family Medicine

## 2017-11-18 DIAGNOSIS — Z3044 Encounter for surveillance of vaginal ring hormonal contraceptive device: Secondary | ICD-10-CM

## 2017-11-18 MED ORDER — ETONOGESTREL-ETHINYL ESTRADIOL 0.12-0.015 MG/24HR VA RING: VAGINAL_RING | VAGINAL | 1 refills | Status: DC

## 2018-02-20 ENCOUNTER — Other Ambulatory Visit: Payer: Self-pay | Admitting: Family Medicine

## 2018-02-20 DIAGNOSIS — Z1231 Encounter for screening mammogram for malignant neoplasm of breast: Secondary | ICD-10-CM

## 2018-03-15 ENCOUNTER — Other Ambulatory Visit: Payer: Self-pay | Admitting: Family Medicine

## 2018-03-15 DIAGNOSIS — I10 Essential (primary) hypertension: Secondary | ICD-10-CM

## 2018-04-06 ENCOUNTER — Ambulatory Visit (HOSPITAL_BASED_OUTPATIENT_CLINIC_OR_DEPARTMENT_OTHER)
Admission: RE | Admit: 2018-04-06 | Discharge: 2018-04-06 | Disposition: A | Discharge status: Home / Self Care | Payer: BLUE CROSS/BLUE SHIELD | Source: Ambulatory Visit | Attending: Family Medicine | Admitting: Family Medicine

## 2018-04-06 DIAGNOSIS — Z1231 Encounter for screening mammogram for malignant neoplasm of breast: Secondary | ICD-10-CM | POA: Insufficient documentation

## 2018-04-18 ENCOUNTER — Other Ambulatory Visit (HOSPITAL_COMMUNITY)
Admission: RE | Admit: 2018-04-18 | Discharge: 2018-04-18 | Disposition: A | Discharge status: Home / Self Care | Payer: BLUE CROSS/BLUE SHIELD | Source: Ambulatory Visit | Attending: Family Medicine | Admitting: Family Medicine

## 2018-04-18 ENCOUNTER — Encounter: Payer: Self-pay | Admitting: Family Medicine

## 2018-04-18 ENCOUNTER — Ambulatory Visit (INDEPENDENT_AMBULATORY_CARE_PROVIDER_SITE_OTHER): Payer: BLUE CROSS/BLUE SHIELD | Admitting: Family Medicine

## 2018-04-18 VITALS — BP 126/80 | HR 86 | Temp 98.1°F | Resp 16 | Ht 65.0 in | Wt 139.0 lb

## 2018-04-18 DIAGNOSIS — Z0000 Encounter for general adult medical examination without abnormal findings: Secondary | ICD-10-CM

## 2018-04-18 DIAGNOSIS — Z3044 Encounter for surveillance of vaginal ring hormonal contraceptive device: Secondary | ICD-10-CM

## 2018-04-18 DIAGNOSIS — I10 Essential (primary) hypertension: Secondary | ICD-10-CM | POA: Diagnosis not present

## 2018-04-18 DIAGNOSIS — Z1211 Encounter for screening for malignant neoplasm of colon: Secondary | ICD-10-CM

## 2018-04-18 DIAGNOSIS — Z01419 Encounter for gynecological examination (general) (routine) without abnormal findings: Secondary | ICD-10-CM | POA: Insufficient documentation

## 2018-04-18 LAB — COMPREHENSIVE METABOLIC PANEL
ALK PHOS: 63 U/L (ref 39–117)
ALT: 17 U/L (ref 0–35)
AST: 24 U/L (ref 0–37)
Albumin: 4.7 g/dL (ref 3.5–5.2)
BUN: 14 mg/dL (ref 6–23)
CHLORIDE: 103 meq/L (ref 96–112)
CO2: 25 meq/L (ref 19–32)
Calcium: 9.9 mg/dL (ref 8.4–10.5)
Creatinine, Ser: 0.93 mg/dL (ref 0.40–1.20)
GFR: 67.54 mL/min (ref >60.00)
GLUCOSE: 84 mg/dL (ref 70–99)
POTASSIUM: 3.8 meq/L (ref 3.5–5.1)
Sodium: 139 mEq/L (ref 135–145)
TOTAL PROTEIN: 8.5 g/dL — AB (ref 6.0–8.3)
Total Bilirubin: 0.7 mg/dL (ref 0.2–1.2)

## 2018-04-18 LAB — POC HEMOCCULT BLD/STL (OFFICE/1-CARD/DIAGNOSTIC)
Card #1 Date: 9242019
Fecal Occult Blood, POC: NEGATIVE

## 2018-04-18 LAB — CBC WITH DIFFERENTIAL/PLATELET
Basophils Absolute: 0.1 10*3/uL (ref 0.0–0.1)
Basophils Relative: 1.4 % (ref 0.0–3.0)
EOS PCT: 0.8 % (ref 0.0–5.0)
Eosinophils Absolute: 0 10*3/uL (ref 0.0–0.7)
HCT: 39.9 % (ref 36.0–46.0)
Hemoglobin: 12.8 g/dL (ref 12.0–15.0)
LYMPHS ABS: 1.5 10*3/uL (ref 0.7–4.0)
Lymphocytes Relative: 25 % (ref 12.0–46.0)
MCHC: 32 g/dL (ref 30.0–36.0)
MCV: 85.5 fl (ref 78.0–100.0)
MONO ABS: 0.5 10*3/uL (ref 0.1–1.0)
Monocytes Relative: 7.4 % (ref 3.0–12.0)
NEUTROS PCT: 65.4 % (ref 43.0–77.0)
Neutro Abs: 4 10*3/uL (ref 1.4–7.7)
PLATELETS: 271 10*3/uL (ref 150.0–400.0)
RBC: 4.67 Mil/uL (ref 3.87–5.11)
RDW: 12.8 % (ref 11.5–15.5)
WBC: 6.2 10*3/uL (ref 4.0–10.5)

## 2018-04-18 LAB — POC URINALSYSI DIPSTICK (AUTOMATED)
Bilirubin, UA: NEGATIVE
Blood, UA: NEGATIVE
Glucose, UA: NEGATIVE
LEUKOCYTES UA: NEGATIVE
Nitrite, UA: NEGATIVE
PH UA: 6 (ref 5.0–8.0)
PROTEIN UA: NEGATIVE
SPEC GRAV UA: 1.02 (ref 1.010–1.025)
Urobilinogen, UA: 0.2 E.U./dL

## 2018-04-18 LAB — LIPID PANEL
Cholesterol: 242 mg/dL — ABNORMAL HIGH (ref 0–200)
HDL: 69.5 mg/dL (ref >39.00)
LDL CALC: 163 mg/dL — AB (ref 0–99)
NONHDL: 172.53
Total CHOL/HDL Ratio: 3
Triglycerides: 49 mg/dL (ref 0.0–149.0)
VLDL: 9.8 mg/dL (ref 0.0–40.0)

## 2018-04-18 LAB — TSH: TSH: 1.21 u[IU]/mL (ref 0.35–4.50)

## 2018-04-18 MED ORDER — ETONOGESTREL-ETHINYL ESTRADIOL 0.12-0.015 MG/24HR VA RING: VAGINAL_RING | VAGINAL | 1 refills | Status: DC

## 2018-04-18 NOTE — Progress Notes (Signed)
Subjective:     Margaret Gibson is a 52 y.o. female and is here for a comprehensive physical exam. The patient reports no problems.  Pt refusing shingrix and flu vaccine.    Social History   Socioeconomic History  . Marital status: Married    Spouse name: Not on file  . Number of children: Not on file  . Years of education: Not on file  . Highest education level: Not on file  Occupational History    Employer: [employer]    Comment: [employer]  Social Needs  . Financial resource strain: Not on file  . Food insecurity:    Worry: Not on file    Inability: Not on file  . Transportation needs:    Medical: Not on file    Non-medical: Not on file  Tobacco Use  . Smoking status: Never Smoker  . Smokeless tobacco: Never Used  Substance and Sexual Activity  . Alcohol use: No  . Drug use: No  . Sexual activity: Yes  Lifestyle  . Physical activity:    Days per week: Not on file    Minutes per session: Not on file  . Stress: Not on file  Relationships  . Social connections:    Talks on phone: Not on file    Gets together: Not on file    Attends religious service: Not on file    Active member of club or organization: Not on file    Attends meetings of clubs or organizations: Not on file    Relationship status: Not on file  . Intimate partner violence:    Fear of current or ex partner: Not on file    Emotionally abused: Not on file    Physically abused: Not on file    Forced sexual activity: Not on file  Other Topics Concern  . Not on file  Social History Narrative   Exercise -- pedometer 10.000 steps   Health Maintenance  Topic Date Due  . COLONOSCOPY  03/25/2017  . PAP SMEAR  01/01/2018  . INFLUENZA VACCINE  02/24/2019 (Originally 02/23/2018)  . HIV Screening  04/18/2024 (Originally 03/25/1982)  . MAMMOGRAM  04/07/2019  . TETANUS/TDAP  11/06/2023    The following portions of the patient's history were reviewed and updated as appropriate:  She  has no past medical  history on file. She does not have any pertinent problems on file. She  has a past surgical history that includes Leg Surgery (08/2008). Her family history includes Cancer (age of onset: 35) in her sister; Hyperlipidemia in her mother; Hypertension in her father. She  reports that she has never smoked. She has never used smokeless tobacco. She reports that she does not drink alcohol or use drugs. She has a current medication list which includes the following prescription(s): amlodipine, etonogestrel-ethinyl estradiol, flaxseed (linseed), garlic, metoprolol succinate, hair/skin/nails, and fish oil triple strength. Current Outpatient Medications on File Prior to Visit  Medication Sig Dispense Refill  . amLODipine (NORVASC) 10 MG tablet Take 1 tablet (10 mg total) by mouth daily. 90 tablet 3  . Flaxseed, Linseed, (FLAX SEED OIL PO) Take by mouth daily.      Marland Kitchen GARLIC PO Take by mouth.    . metoprolol succinate (TOPROL-XL) 100 MG 24 hr tablet TAKE 1 AND 1/2 TABLETS BY MOUTH DAILY WITH OR IMMEDIATELY FOLLOWING A MEAL 135 tablet 0  . Multiple Vitamins-Minerals (HAIR/SKIN/NAILS) TABS Take by mouth daily.      . Omega-3 Fatty Acids (FISH OIL TRIPLE STRENGTH)  1400 MG CAPS Take by mouth daily.       No current facility-administered medications on file prior to visit.    She has No Known Allergies..  Review of Systems Review of Systems  Constitutional: Negative for activity change, appetite change and fatigue.  HENT: Negative for hearing loss, congestion, tinnitus and ear discharge.  dentist [id number] Eyes: Negative for visual disturbance (see optho q1y -- vision corrected to 20/20 with glasses).  Respiratory: Negative for cough, chest tightness and shortness of breath.   Cardiovascular: Negative for chest pain, palpitations and leg swelling.  Gastrointestinal: Negative for abdominal pain, diarrhea, constipation and abdominal distention.  Genitourinary: Negative for urgency, frequency, decreased urine volume  and difficulty urinating.  Musculoskeletal: Negative for back pain, arthralgias and gait problem.  Skin: Negative for color change, pallor and rash.  Neurological: Negative for dizziness, light-headedness, numbness and headaches.  Hematological: Negative for adenopathy. Does not bruise/bleed easily.  Psychiatric/Behavioral: Negative for suicidal ideas, confusion, sleep disturbance, self-injury, dysphoric mood, decreased concentration and agitation.      Objective:    BP 126/80 (BP Location: Left Arm, Cuff Size: Normal)   Pulse 86   Temp 98.1 F (36.7 C) (Oral)   Resp 16   Ht 5\' 5"  (1.651 m)   Wt 139 lb (63 kg)   LMP 04/14/2018   SpO2 98%   BMI 23.13 kg/m  General appearance: alert, cooperative, appears stated age and no distress Head: Normocephalic, without obvious abnormality, atraumatic Eyes: conjunctivae/corneas clear. PERRL, EOM's intact. Fundi benign. Ears: normal TM's and external ear canals both ears Nose: Nares normal. Septum midline. Mucosa normal. No drainage or sinus tenderness. Throat: lips, mucosa, and tongue normal; teeth and gums normal Neck: no adenopathy, no carotid bruit, no JVD, supple, symmetrical, trachea midline and thyroid not enlarged, symmetric, no tenderness/mass/nodules Back: symmetric, no curvature. ROM normal. No CVA tenderness. Lungs: clear to auscultation bilaterally Breasts: normal appearance, no masses or tenderness Heart: regular rate and rhythm, S1, S2 normal, no murmur, click, rub or gallop Abdomen: soft, non-tender; bowel sounds normal; no masses,  no organomegaly Pelvic: cervix normal in appearance, external genitalia normal, no adnexal masses or tenderness, no cervical motion tenderness, rectovaginal septum normal, uterus normal size, shape, and consistency, vagina normal without discharge and pap done, heme neg brown stool Extremities: extremities normal, atraumatic, no cyanosis or edema Pulses: 2+ and symmetric Skin: Skin color, texture,  turgor normal. No rashes or lesions Lymph nodes: Cervical, supraclavicular, and axillary nodes normal. Neurologic: Alert and oriented X 3, normal strength and tone. Normal symmetric reflexes. Normal coordination and gait    Assessment:    Healthy female exam.      Plan:    ghm utd  Check labs  See After Visit Summary for Counseling Recommendations    1. Encounter for surveillance of vaginal ring hormonal contraceptive device  - etonogestrel-ethinyl estradiol (NUVARING) 0.12-0.015 MG/24HR vaginal ring; UNWRAP AND INSERT VAGINALLY AND LEAVE IN PLACE FOR 3 CONSECUTIVE WEEKS, THEN REMOVE FOR 1 WEEK  Dispense: 3 each; Refill: 1  2. Preventative health care See above  - CBC with Differential/Platelet - Comprehensive metabolic panel - Lipid panel - TSH - POCT Urinalysis Dipstick (Automated)  3. Essential hypertension Well controlled, no changes to meds. Encouraged heart healthy diet such as the DASH diet and exercise as tolerated.  - CBC with Differential/Platelet - Comprehensive metabolic panel - Lipid panel - TSH - POCT Urinalysis Dipstick (Automated)

## 2018-04-18 NOTE — Patient Instructions (Signed)
Preventive Care 40-64 Years, Female Preventive care refers to lifestyle choices and visits with your health care provider that can promote health and wellness. What does preventive care include?  A yearly physical exam. This is also called an annual well check.  Dental exams once or twice a year.  Routine eye exams. Ask your health care provider how often you should have your eyes checked.  Personal lifestyle choices, including: ? Daily care of your teeth and gums. ? Regular physical activity. ? Eating a healthy diet. ? Avoiding tobacco and drug use. ? Limiting alcohol use. ? Practicing safe sex. ? Taking low-dose aspirin daily starting at age 58. ? Taking vitamin and mineral supplements as recommended by your health care provider. What happens during an annual well check? The services and screenings done by your health care provider during your annual well check will depend on your age, overall health, lifestyle risk factors, and family history of disease. Counseling Your health care provider may ask you questions about your:  Alcohol use.  Tobacco use.  Drug use.  Emotional well-being.  Home and relationship well-being.  Sexual activity.  Eating habits.  Work and work Statistician.  Method of birth control.  Menstrual cycle.  Pregnancy history.  Screening You may have the following tests or measurements:  Height, weight, and BMI.  Blood pressure.  Lipid and cholesterol levels. These may be checked every 5 years, or more frequently if you are over 81 years old.  Skin check.  Lung cancer screening. You may have this screening every year starting at age 78 if you have a 30-pack-year history of smoking and currently smoke or have quit within the past 15 years.  Fecal occult blood test (FOBT) of the stool. You may have this test every year starting at age 65.  Flexible sigmoidoscopy or colonoscopy. You may have a sigmoidoscopy every 5 years or a colonoscopy  every 10 years starting at age 30.  Hepatitis C blood test.  Hepatitis B blood test.  Sexually transmitted disease (STD) testing.  Diabetes screening. This is done by checking your blood sugar (glucose) after you have not eaten for a while (fasting). You may have this done every 1-3 years.  Mammogram. This may be done every 1-2 years. Talk to your health care provider about when you should start having regular mammograms. This may depend on whether you have a family history of breast cancer.  BRCA-related cancer screening. This may be done if you have a family history of breast, ovarian, tubal, or peritoneal cancers.  Pelvic exam and Pap test. This may be done every 3 years starting at age 80. Starting at age 36, this may be done every 5 years if you have a Pap test in combination with an HPV test.  Bone density scan. This is done to screen for osteoporosis. You may have this scan if you are at high risk for osteoporosis.  Discuss your test results, treatment options, and if necessary, the need for more tests with your health care provider. Vaccines Your health care provider may recommend certain vaccines, such as:  Influenza vaccine. This is recommended every year.  Tetanus, diphtheria, and acellular pertussis (Tdap, Td) vaccine. You may need a Td booster every 10 years.  Varicella vaccine. You may need this if you have not been vaccinated.  Zoster vaccine. You may need this after age 5.  Measles, mumps, and rubella (MMR) vaccine. You may need at least one dose of MMR if you were born in  1957 or later. You may also need a second dose.  Pneumococcal 13-valent conjugate (PCV13) vaccine. You may need this if you have certain conditions and were not previously vaccinated.  Pneumococcal polysaccharide (PPSV23) vaccine. You may need one or two doses if you smoke cigarettes or if you have certain conditions.  Meningococcal vaccine. You may need this if you have certain  conditions.  Hepatitis A vaccine. You may need this if you have certain conditions or if you travel or work in places where you may be exposed to hepatitis A.  Hepatitis B vaccine. You may need this if you have certain conditions or if you travel or work in places where you may be exposed to hepatitis B.  Haemophilus influenzae type b (Hib) vaccine. You may need this if you have certain conditions.  Talk to your health care provider about which screenings and vaccines you need and how often you need them. This information is not intended to replace advice given to you by your health care provider. Make sure you discuss any questions you have with your health care provider. Document Released: 08/08/2015 Document Revised: 03/31/2016 Document Reviewed: 05/13/2015 Elsevier Interactive Patient Education  2018 Elsevier Inc.  

## 2018-04-19 NOTE — Addendum Note (Signed)
Addended byConrad [place] D on: 04/19/2018 12:45 PM   Modules accepted: Orders

## 2018-04-20 LAB — CYTOLOGY - PAP
DIAGNOSIS: NEGATIVE
HPV (WINDOPATH): NOT DETECTED

## 2018-05-17 ENCOUNTER — Other Ambulatory Visit: Payer: Self-pay | Admitting: Family Medicine

## 2018-05-17 DIAGNOSIS — I10 Essential (primary) hypertension: Secondary | ICD-10-CM

## 2018-06-12 ENCOUNTER — Other Ambulatory Visit: Payer: Self-pay | Admitting: Family Medicine

## 2018-06-12 DIAGNOSIS — I10 Essential (primary) hypertension: Secondary | ICD-10-CM

## 2018-06-14 ENCOUNTER — Other Ambulatory Visit: Payer: Self-pay | Admitting: Family Medicine

## 2018-06-14 DIAGNOSIS — I10 Essential (primary) hypertension: Secondary | ICD-10-CM

## 2018-08-17 ENCOUNTER — Other Ambulatory Visit: Payer: Self-pay | Admitting: Family Medicine

## 2018-08-17 DIAGNOSIS — I10 Essential (primary) hypertension: Secondary | ICD-10-CM

## 2018-09-10 ENCOUNTER — Other Ambulatory Visit: Payer: Self-pay | Admitting: Family Medicine

## 2018-09-10 DIAGNOSIS — I10 Essential (primary) hypertension: Secondary | ICD-10-CM

## 2018-09-12 ENCOUNTER — Other Ambulatory Visit: Payer: Self-pay | Admitting: Family Medicine

## 2018-09-12 DIAGNOSIS — I10 Essential (primary) hypertension: Secondary | ICD-10-CM

## 2018-12-19 ENCOUNTER — Telehealth: Payer: Self-pay | Admitting: Family Medicine

## 2018-12-19 ENCOUNTER — Other Ambulatory Visit: Payer: Self-pay | Admitting: Family Medicine

## 2018-12-19 DIAGNOSIS — I10 Essential (primary) hypertension: Secondary | ICD-10-CM

## 2018-12-19 MED ORDER — AMLODIPINE BESYLATE 10 MG PO TABS: ORAL_TABLET | ORAL | 0 refills | Status: DC

## 2018-12-19 NOTE — Telephone Encounter (Signed)
Please advise on dosage of amlodipine that Pt should be on. Amlodipine 5mg  and 10mg  on med list.

## 2018-12-19 NOTE — Telephone Encounter (Signed)
Copied from CRM [id number]. Topic: Quick Communication - Rx Refill/Question >> Dec 19, 2018  1:53 PM Retta Diones wrote: Medication: amLODipine (NORVASC) 10 MG tablet  Has the patient contacted their pharmacy? Yes.   (Agent: If no, request that the patient contact the pharmacy for the refill.) (Agent: If yes, when and what did the pharmacy advise?)  Preferred Pharmacy (with phone number or street name): [id number] - [place]  - [address] OF [address] [phone] (Phone) [phone] (Fax)    Agent: Please be advised that RX refills may take up to 3 business days. We ask that you follow-up with your pharmacy.

## 2018-12-19 NOTE — Telephone Encounter (Signed)
Needs needs f/u norvasc was sent to pharmacy

## 2018-12-20 ENCOUNTER — Telehealth: Payer: Self-pay | Admitting: *Deleted

## 2018-12-20 NOTE — Telephone Encounter (Signed)
-----   Message -----  From: [name]  Sent: 12/19/2018  1:14 PM EDT  To: [facility] Pool  Subject: Appointment Request                 Appointment Request From: [name]    With Provider: Donato Schultz, DO [[facility] [place]]    Preferred Date Range: Any date 04/20/2019 or later    Preferred Times: Monday Morning, Tuesday Morning, Wednesday Morning, Thursday Morning, Friday Morning    Reason for visit: Request an Appointment    Comments:  Yearly routine exam

## 2018-12-20 NOTE — Telephone Encounter (Signed)
Pt schedule for CPE on 04-20-2019. Done

## 2018-12-20 NOTE — Telephone Encounter (Signed)
Patient notified

## 2019-03-11 ENCOUNTER — Other Ambulatory Visit: Payer: Self-pay | Admitting: Family Medicine

## 2019-03-11 DIAGNOSIS — I10 Essential (primary) hypertension: Secondary | ICD-10-CM

## 2019-03-19 ENCOUNTER — Other Ambulatory Visit: Payer: Self-pay | Admitting: *Deleted

## 2019-03-19 DIAGNOSIS — I10 Essential (primary) hypertension: Secondary | ICD-10-CM

## 2019-03-19 MED ORDER — AMLODIPINE BESYLATE 10 MG PO TABS: ORAL_TABLET | ORAL | 0 refills | Status: DC

## 2019-04-11 ENCOUNTER — Encounter (HOSPITAL_BASED_OUTPATIENT_CLINIC_OR_DEPARTMENT_OTHER): Payer: BLUE CROSS/BLUE SHIELD

## 2019-04-16 ENCOUNTER — Other Ambulatory Visit (HOSPITAL_BASED_OUTPATIENT_CLINIC_OR_DEPARTMENT_OTHER): Payer: Self-pay | Admitting: Family Medicine

## 2019-04-16 DIAGNOSIS — Z1231 Encounter for screening mammogram for malignant neoplasm of breast: Secondary | ICD-10-CM

## 2019-04-17 ENCOUNTER — Ambulatory Visit (HOSPITAL_BASED_OUTPATIENT_CLINIC_OR_DEPARTMENT_OTHER)
Admission: RE | Admit: 2019-04-17 | Discharge: 2019-04-17 | Disposition: A | Discharge status: Home / Self Care | Payer: BC Managed Care – PPO | Source: Ambulatory Visit | Attending: Family Medicine | Admitting: Family Medicine

## 2019-04-17 ENCOUNTER — Other Ambulatory Visit: Payer: Self-pay

## 2019-04-17 DIAGNOSIS — Z1231 Encounter for screening mammogram for malignant neoplasm of breast: Secondary | ICD-10-CM

## 2019-04-19 ENCOUNTER — Other Ambulatory Visit: Payer: Self-pay

## 2019-04-20 ENCOUNTER — Encounter: Payer: BLUE CROSS/BLUE SHIELD | Admitting: Family Medicine

## 2019-04-20 ENCOUNTER — Ambulatory Visit (INDEPENDENT_AMBULATORY_CARE_PROVIDER_SITE_OTHER): Payer: BC Managed Care – PPO | Admitting: Family Medicine

## 2019-04-20 ENCOUNTER — Encounter: Payer: Self-pay | Admitting: Family Medicine

## 2019-04-20 VITALS — BP 150/90 | HR 91 | Temp 97.0°F | Resp 18 | Ht 65.0 in | Wt 134.0 lb

## 2019-04-20 DIAGNOSIS — Z0000 Encounter for general adult medical examination without abnormal findings: Secondary | ICD-10-CM | POA: Diagnosis not present

## 2019-04-20 DIAGNOSIS — I10 Essential (primary) hypertension: Secondary | ICD-10-CM

## 2019-04-20 DIAGNOSIS — Z1211 Encounter for screening for malignant neoplasm of colon: Secondary | ICD-10-CM

## 2019-04-20 LAB — CBC WITH DIFFERENTIAL/PLATELET
Basophils Absolute: 0 10*3/uL (ref 0.0–0.1)
Basophils Relative: 1 % (ref 0.0–3.0)
Eosinophils Absolute: 0.1 10*3/uL (ref 0.0–0.7)
Eosinophils Relative: 2 % (ref 0.0–5.0)
HCT: 39.1 % (ref 36.0–46.0)
Hemoglobin: 12.6 g/dL (ref 12.0–15.0)
Lymphocytes Relative: 34 % (ref 12.0–46.0)
Lymphs Abs: 1.6 10*3/uL (ref 0.7–4.0)
MCHC: 32.3 g/dL (ref 30.0–36.0)
MCV: 84.8 fl (ref 78.0–100.0)
Monocytes Absolute: 0.4 10*3/uL (ref 0.1–1.0)
Monocytes Relative: 9.3 % (ref 3.0–12.0)
Neutro Abs: 2.6 10*3/uL (ref 1.4–7.7)
Neutrophils Relative %: 53.7 % (ref 43.0–77.0)
Platelets: 245 10*3/uL (ref 150.0–400.0)
RBC: 4.61 Mil/uL (ref 3.87–5.11)
RDW: 12.8 % (ref 11.5–15.5)
WBC: 4.8 10*3/uL (ref 4.0–10.5)

## 2019-04-20 LAB — LIPID PANEL
Cholesterol: 239 mg/dL — ABNORMAL HIGH (ref 0–200)
HDL: 75 mg/dL (ref >39.00)
LDL Cholesterol: 155 mg/dL — ABNORMAL HIGH (ref 0–99)
NonHDL: 164.27
Total CHOL/HDL Ratio: 3
Triglycerides: 48 mg/dL (ref 0.0–149.0)
VLDL: 9.6 mg/dL (ref 0.0–40.0)

## 2019-04-20 LAB — COMPREHENSIVE METABOLIC PANEL
ALT: 19 U/L (ref 0–35)
AST: 25 U/L (ref 0–37)
Albumin: 4.6 g/dL (ref 3.5–5.2)
Alkaline Phosphatase: 70 U/L (ref 39–117)
BUN: 15 mg/dL (ref 6–23)
CO2: 28 mEq/L (ref 19–32)
Calcium: 10.4 mg/dL (ref 8.4–10.5)
Chloride: 101 mEq/L (ref 96–112)
Creatinine, Ser: 0.88 mg/dL (ref 0.40–1.20)
GFR: 81.63 mL/min (ref >60.00)
Glucose, Bld: 90 mg/dL (ref 70–99)
Potassium: 4.4 mEq/L (ref 3.5–5.1)
Sodium: 139 mEq/L (ref 135–145)
Total Bilirubin: 0.7 mg/dL (ref 0.2–1.2)
Total Protein: 8.1 g/dL (ref 6.0–8.3)

## 2019-04-20 LAB — TSH: TSH: 1.89 u[IU]/mL (ref 0.35–4.50)

## 2019-04-20 NOTE — Assessment & Plan Note (Addendum)
Pt bp at home runs 117/78 Well controlled, no changes to meds. Encouraged heart healthy diet such as the DASH diet and exercise as tolerated.

## 2019-04-20 NOTE — Progress Notes (Signed)
Subjective:     Margaret Gibson is a 53 y.o. female and is here for a comprehensive physical exam. The patient reports no problems. She needs f/u bp and labs as well.   Social History   Socioeconomic History  . Marital status: Married    Spouse name: Not on file  . Number of children: Not on file  . Years of education: Not on file  . Highest education level: Not on file  Occupational History    Employer: [employer]    Comment: [employer]  Social Needs  . Financial resource strain: Not on file  . Food insecurity    Worry: Not on file    Inability: Not on file  . Transportation needs    Medical: Not on file    Non-medical: Not on file  Tobacco Use  . Smoking status: Never Smoker  . Smokeless tobacco: Never Used  Substance and Sexual Activity  . Alcohol use: No  . Drug use: No  . Sexual activity: Yes  Lifestyle  . Physical activity    Days per week: Not on file    Minutes per session: Not on file  . Stress: Not on file  Relationships  . Social Musician on phone: Not on file    Gets together: Not on file    Attends religious service: Not on file    Active member of club or organization: Not on file    Attends meetings of clubs or organizations: Not on file    Relationship status: Not on file  . Intimate partner violence    Fear of current or ex partner: Not on file    Emotionally abused: Not on file    Physically abused: Not on file    Forced sexual activity: Not on file  Other Topics Concern  . Not on file  Social History Narrative   Exercise -- pedometer 10.000 steps   Health Maintenance  Topic Date Due  . COLONOSCOPY  03/25/2017  . HIV Screening  04/18/2024 (Originally 03/25/1981)  . MAMMOGRAM  04/16/2020  . PAP SMEAR-Modifier  04/18/2021  . TETANUS/TDAP  11/06/2023  . INFLUENZA VACCINE  Discontinued    The following portions of the patient's history were reviewed and updated as appropriate:  She  has no past medical history on file. She does  not have any pertinent problems on file. She  has a past surgical history that includes Leg Surgery (08/2008). Her family history includes Cancer (age of onset: 58) in her sister; Hyperlipidemia in her mother; Hypertension in her father. She  reports that she has never smoked. She has never used smokeless tobacco. She reports that she does not drink alcohol or use drugs. She has a current medication list which includes the following prescription(s): amlodipine, flaxseed (linseed), garlic, metoprolol succinate, hair/skin/nails, and fish oil triple strength. Current Outpatient Medications on File Prior to Visit  Medication Sig Dispense Refill  . amLODipine (NORVASC) 10 MG tablet TAKE 1 TABLET(10 MG) BY MOUTH DAILY 90 tablet 0  . Flaxseed, Linseed, (FLAX SEED OIL PO) Take by mouth daily.      Marland Kitchen GARLIC PO Take by mouth.    . metoprolol succinate (TOPROL-XL) 100 MG 24 hr tablet TAKE 1 AND 1/2 TABLETS BY MOUTH DAILY WITH OR IMMEDIATELY FOLLOWING A MEAL 135 tablet 1  . Multiple Vitamins-Minerals (HAIR/SKIN/NAILS) TABS Take by mouth daily.      . Omega-3 Fatty Acids (FISH OIL TRIPLE STRENGTH) 1400 MG CAPS Take by mouth  daily.       No current facility-administered medications on file prior to visit.    She has No Known Allergies..  Review of Systems Review of Systems  Constitutional: Negative for activity change, appetite change and fatigue.  HENT: Negative for hearing loss, congestion, tinnitus and ear discharge.  dentist q42m Eyes: Negative for visual disturbance (see optho q1y -- vision corrected to 20/20 with glasses).  Respiratory: Negative for cough, chest tightness and shortness of breath.   Cardiovascular: Negative for chest pain, palpitations and leg swelling.  Gastrointestinal: Negative for abdominal pain, diarrhea, constipation and abdominal distention.  Genitourinary: Negative for urgency, frequency, decreased urine volume and difficulty urinating.  Musculoskeletal: Negative for back  pain, arthralgias and gait problem.  Skin: Negative for color change, pallor and rash.  Neurological: Negative for dizziness, light-headedness, numbness and headaches.  Hematological: Negative for adenopathy. Does not bruise/bleed easily.  Psychiatric/Behavioral: Negative for suicidal ideas, confusion, sleep disturbance, self-injury, dysphoric mood, decreased concentration and agitation.        Objective:    BP (!) 150/90 (BP Location: Right Arm, Patient Position: Sitting, Cuff Size: Normal)   Pulse 91   Temp (!) 97 F (36.1 C) (Temporal)   Resp 18   Ht 5\' 5"  (1.651 m)   Wt 134 lb (60.8 kg)   LMP 04/14/2018   SpO2 98%   BMI 22.30 kg/m  General appearance: alert, cooperative, appears stated age and no distress Head: Normocephalic, without obvious abnormality, atraumatic Eyes: negative findings: lids and lashes normal, conjunctivae and sclerae normal and pupils equal, round, reactive to light and accomodation Ears: normal TM's and external ear canals both ears Nose: Nares normal. Septum midline. Mucosa normal. No drainage or sinus tenderness. Throat: lips, mucosa, and tongue normal; teeth and gums normal Neck: no adenopathy, no carotid bruit, no JVD, supple, symmetrical, trachea midline and thyroid not enlarged, symmetric, no tenderness/mass/nodules Back: symmetric, no curvature. ROM normal. No CVA tenderness. Lungs: clear to auscultation bilaterally Breasts: normal appearance, no masses or tenderness Heart: regular rate and rhythm, S1, S2 normal, no murmur, click, rub or gallop Abdomen: soft, non-tender; bowel sounds normal; no masses,  no organomegaly Pelvic: deferred Extremities: extremities normal, atraumatic, no cyanosis or edema Pulses: 2+ and symmetric Skin: Skin color, texture, turgor normal. No rashes or lesions Lymph nodes: Cervical, supraclavicular, and axillary nodes normal. Neurologic: Alert and oriented X 3, normal strength and tone. Normal symmetric reflexes.  Normal coordination and gait    Assessment:    Healthy female exam.      Plan:    ghm utd Check labs  See After Visit Summary for Counseling Recommendations    1. Preventative health care See above - CBC with Differential/Platelet - Lipid panel - Comprehensive metabolic panel - TSH - Lipid panel - CBC with Differential/Platelet - TSH - Comprehensive metabolic panel  2. Essential hypertension Well controlled, no changes to meds. Encouraged heart healthy diet such as the DASH diet and exercise as tolerated.   - CBC with Differential/Platelet - Lipid panel - Comprehensive metabolic panel - TSH - Lipid panel - CBC with Differential/Platelet - TSH - Comprehensive metabolic panel  3. Colon cancer screening  - Ambulatory referral to Gastroenterology

## 2019-05-21 ENCOUNTER — Encounter: Payer: Self-pay | Admitting: Family Medicine

## 2019-06-27 ENCOUNTER — Encounter: Payer: Self-pay | Admitting: Family Medicine

## 2019-06-27 DIAGNOSIS — I10 Essential (primary) hypertension: Secondary | ICD-10-CM

## 2019-06-27 MED ORDER — METOPROLOL SUCCINATE ER 100 MG PO TB24: ORAL_TABLET | ORAL | 1 refills | Status: DC

## 2019-08-27 ENCOUNTER — Telehealth: Payer: Self-pay

## 2019-08-27 NOTE — Telephone Encounter (Signed)
Verlon Au from [facility] called in to get medical records for the patient [employer] please give her a call at [phone]. Fax# [phone]

## 2019-08-29 NOTE — Telephone Encounter (Signed)
Called back. Operator could not find patient. States he was going to send a request to his supervisor and have some one call me back

## 2020-03-03 ENCOUNTER — Other Ambulatory Visit (HOSPITAL_BASED_OUTPATIENT_CLINIC_OR_DEPARTMENT_OTHER): Payer: Self-pay | Admitting: Family Medicine

## 2020-03-03 DIAGNOSIS — Z1231 Encounter for screening mammogram for malignant neoplasm of breast: Secondary | ICD-10-CM

## 2020-04-17 ENCOUNTER — Other Ambulatory Visit: Payer: Self-pay

## 2020-04-17 ENCOUNTER — Ambulatory Visit (HOSPITAL_BASED_OUTPATIENT_CLINIC_OR_DEPARTMENT_OTHER)
Admission: RE | Admit: 2020-04-17 | Discharge: 2020-04-17 | Disposition: A | Discharge status: Home / Self Care | Payer: BC Managed Care – PPO | Source: Ambulatory Visit | Attending: Family Medicine | Admitting: Family Medicine

## 2020-04-17 DIAGNOSIS — Z1231 Encounter for screening mammogram for malignant neoplasm of breast: Secondary | ICD-10-CM

## 2020-05-02 ENCOUNTER — Encounter: Payer: Self-pay | Admitting: Family Medicine

## 2020-05-02 ENCOUNTER — Ambulatory Visit (INDEPENDENT_AMBULATORY_CARE_PROVIDER_SITE_OTHER): Payer: BC Managed Care – PPO | Admitting: Family Medicine

## 2020-05-02 ENCOUNTER — Other Ambulatory Visit: Payer: Self-pay

## 2020-05-02 VITALS — BP 146/88 | HR 81 | Temp 98.1°F | Resp 12 | Ht 65.0 in | Wt 142.0 lb

## 2020-05-02 DIAGNOSIS — Z1211 Encounter for screening for malignant neoplasm of colon: Secondary | ICD-10-CM | POA: Diagnosis not present

## 2020-05-02 DIAGNOSIS — I10 Essential (primary) hypertension: Secondary | ICD-10-CM | POA: Diagnosis not present

## 2020-05-02 DIAGNOSIS — Z0000 Encounter for general adult medical examination without abnormal findings: Secondary | ICD-10-CM

## 2020-05-02 DIAGNOSIS — Z1159 Encounter for screening for other viral diseases: Secondary | ICD-10-CM

## 2020-05-02 MED ORDER — AMLODIPINE BESYLATE 10 MG PO TABS: ORAL_TABLET | ORAL | 0 refills | Status: DC

## 2020-05-02 MED ORDER — METOPROLOL SUCCINATE ER 100 MG PO TB24: ORAL_TABLET | ORAL | 1 refills | Status: DC

## 2020-05-02 NOTE — Patient Instructions (Signed)

## 2020-05-02 NOTE — Assessment & Plan Note (Signed)
Well controlled, no changes to meds. Encouraged heart healthy diet such as the DASH diet and exercise as tolerated. --- well controlled at home  con't norvasc

## 2020-05-02 NOTE — Progress Notes (Signed)
Subjective:     Margaret Gibson is a 54 y.o. female and is here for a comprehensive physical exam. The patient reports no problems.  Social History   Socioeconomic History  . Marital status: Married    Spouse name: Not on file  . Number of children: Not on file  . Years of education: Not on file  . Highest education level: Not on file  Occupational History    Employer: [employer]    Comment: [employer]  Tobacco Use  . Smoking status: Never Smoker  . Smokeless tobacco: Never Used  Substance and Sexual Activity  . Alcohol use: No  . Drug use: No  . Sexual activity: Yes    Partners: Male  Other Topics Concern  . Not on file  Social History Narrative   Exercise -- pedometer 10.000 steps   Social Determinants of Health   Financial Resource Strain:   . Difficulty of Paying Living Expenses: Not on file  Food Insecurity:   . Worried About Programme researcher, broadcasting/film/video in the Last Year: Not on file  . Ran Out of Food in the Last Year: Not on file  Transportation Needs:   . Lack of Transportation (Medical): Not on file  . Lack of Transportation (Non-Medical): Not on file  Physical Activity:   . Days of Exercise per Week: Not on file  . Minutes of Exercise per Session: Not on file  Stress:   . Feeling of Stress : Not on file  Social Connections:   . Frequency of Communication with Friends and Family: Not on file  . Frequency of Social Gatherings with Friends and Family: Not on file  . Attends Religious Services: Not on file  . Active Member of Clubs or Organizations: Not on file  . Attends Banker Meetings: Not on file  . Marital Status: Not on file  Intimate Partner Violence:   . Fear of Current or Ex-Partner: Not on file  . Emotionally Abused: Not on file  . Physically Abused: Not on file  . Sexually Abused: Not on file   Health Maintenance  Topic Date Due  . Hepatitis C Screening  Never done  . COVID-19 Vaccine (1) Never done  . COLONOSCOPY  Never done  . HIV  Screening  04/18/2024 (Originally 03/25/1982)  . MAMMOGRAM  04/17/2021  . PAP SMEAR-Modifier  04/18/2021  . TETANUS/TDAP  11/06/2023  . INFLUENZA VACCINE  Discontinued    The following portions of the patient's history were reviewed and updated as appropriate:  She  has no past medical history on file. She does not have any pertinent problems on file. She  has a past surgical history that includes Leg Surgery (08/2008). Her family history includes Cancer (age of onset: 62) in her sister; Hyperlipidemia in her mother; Hypertension in her father. She  reports that she has never smoked. She has never used smokeless tobacco. She reports that she does not drink alcohol and does not use drugs. She has a current medication list which includes the following prescription(s): amlodipine, flaxseed (linseed), garlic, metoprolol succinate, hair/skin/nails, and fish oil triple strength. Current Outpatient Medications on File Prior to Visit  Medication Sig Dispense Refill  . Flaxseed, Linseed, (FLAX SEED OIL PO) Take by mouth daily.      Marland Kitchen GARLIC PO Take by mouth.    . Multiple Vitamins-Minerals (HAIR/SKIN/NAILS) TABS Take by mouth daily.      . Omega-3 Fatty Acids (FISH OIL TRIPLE STRENGTH) 1400 MG CAPS Take by mouth  daily.       No current facility-administered medications on file prior to visit.   She has No Known Allergies..  Review of Systems. Review of Systems  Constitutional: Negative for activity change, appetite change and fatigue.  HENT: Negative for hearing loss, congestion, tinnitus and ear discharge.  dentist q6m Eyes: Negative for visual disturbance (see optho q1y -- vision corrected to 20/20 with glasses).  Respiratory: Negative for cough, chest tightness and shortness of breath.   Cardiovascular: Negative for chest pain, palpitations and leg swelling.  Gastrointestinal: Negative for abdominal pain, diarrhea, constipation and abdominal distention.  Genitourinary: Negative for urgency,  frequency, decreased urine volume and difficulty urinating.  Musculoskeletal: Negative for back pain, arthralgias and gait problem.  Skin: Negative for color change, pallor and rash.  Neurological: Negative for dizziness, light-headedness, numbness and headaches.  Hematological: Negative for adenopathy. Does not bruise/bleed easily.  Psychiatric/Behavioral: Negative for suicidal ideas, confusion, sleep disturbance, self-injury, dysphoric mood, decreased concentration and agitation.        Objective:    BP (!) 146/88 (BP Location: Right Arm, Cuff Size: Normal)   Pulse 81   Temp 98.1 F (36.7 C) (Oral)   Resp 12   Ht 5\' 5"  (1.651 m)   Wt 142 lb (64.4 kg)   LMP 04/14/2018   SpO2 97%   BMI 23.63 kg/m  General appearance: alert, cooperative, appears stated age and no distress Head: Normocephalic, without obvious abnormality, atraumatic Eyes: negative findings: lids and lashes normal, conjunctivae and sclerae normal and pupils equal, round, reactive to light and accomodation Ears: normal TM's and external ear canals both ears Neck: no adenopathy, no carotid bruit, no JVD, supple, symmetrical, trachea midline and thyroid not enlarged, symmetric, no tenderness/mass/nodules Back: symmetric, no curvature. ROM normal. No CVA tenderness. Lungs: clear to auscultation bilaterally Breasts: normal appearance, no masses or tenderness Heart: regular rate and rhythm, S1, S2 normal, no murmur, click, rub or gallop Abdomen: soft, non-tender; bowel sounds normal; no masses,  no organomegaly Pelvic: deferred Extremities: extremities normal, atraumatic, no cyanosis or edema Pulses: 2+ and symmetric Skin: Skin color, texture, turgor normal. No rashes or lesions Lymph nodes: Cervical, supraclavicular, and axillary nodes normal. Neurologic: Alert and oriented X 3, normal strength and tone. Normal symmetric reflexes. Normal coordination and gait    Assessment:    Healthy female exam.      Plan:     ghm utd Check labs  See After Visit Summary for Counseling Recommendations    1. Essential hypertension Well controlled, no changes to meds. Encouraged heart healthy diet such as the DASH diet and exercise as tolerated.  - amLODipine (NORVASC) 10 MG tablet; TAKE 1 TABLET(10 MG) BY MOUTH DAILY  Dispense: 90 tablet; Refill: 0 - metoprolol succinate (TOPROL-XL) 100 MG 24 hr tablet; Take with or immediately following a meal.  Dispense: 135 tablet; Refill: 1 - Lipid panel - CBC with Differential/Platelet - TSH - Comprehensive metabolic panel  2. Preventative health care See above - Lipid panel - CBC with Differential/Platelet - TSH - Comprehensive metabolic panel  3. Colon cancer screening  - Ambulatory referral to Gastroenterology  4. Need for hepatitis C screening test  - Hepatitis C antibody  5. White coat syndrome with diagnosis of hypertension bp elevated

## 2020-05-04 ENCOUNTER — Other Ambulatory Visit: Payer: Self-pay | Admitting: Family Medicine

## 2020-05-04 DIAGNOSIS — E785 Hyperlipidemia, unspecified: Secondary | ICD-10-CM

## 2020-05-05 ENCOUNTER — Other Ambulatory Visit: Payer: Self-pay | Admitting: Family Medicine

## 2020-05-05 DIAGNOSIS — E785 Hyperlipidemia, unspecified: Secondary | ICD-10-CM

## 2020-05-05 LAB — COMPREHENSIVE METABOLIC PANEL
AG Ratio: 1.4 (calc) (ref 1.0–2.5)
ALT: 13 U/L (ref 6–29)
AST: 24 U/L (ref 10–35)
Albumin: 4.9 g/dL (ref 3.6–5.1)
Alkaline phosphatase (APISO): 77 U/L (ref 37–153)
BUN: 14 mg/dL (ref 7–25)
CO2: 25 mmol/L (ref 20–32)
Calcium: 10.3 mg/dL (ref 8.6–10.4)
Chloride: 102 mmol/L (ref 98–110)
Creat: 0.92 mg/dL (ref 0.50–1.05)
Globulin: 3.6 g/dL (calc) (ref 1.9–3.7)
Glucose, Bld: 97 mg/dL (ref 65–99)
Potassium: 4.4 mmol/L (ref 3.5–5.3)
Sodium: 140 mmol/L (ref 135–146)
Total Bilirubin: 0.6 mg/dL (ref 0.2–1.2)
Total Protein: 8.5 g/dL — ABNORMAL HIGH (ref 6.1–8.1)

## 2020-05-05 LAB — CBC WITH DIFFERENTIAL/PLATELET
Absolute Monocytes: 429 cells/uL (ref 200–950)
Basophils Absolute: 32 cells/uL (ref 0–200)
Basophils Relative: 0.6 %
Eosinophils Absolute: 58 cells/uL (ref 15–500)
Eosinophils Relative: 1.1 %
HCT: 41.8 % (ref 35.0–45.0)
Hemoglobin: 13.6 g/dL (ref 11.7–15.5)
Lymphs Abs: 1818 cells/uL (ref 850–3900)
MCH: 27.8 pg (ref 27.0–33.0)
MCHC: 32.5 g/dL (ref 32.0–36.0)
MCV: 85.5 fL (ref 80.0–100.0)
MPV: 12.4 fL (ref 7.5–12.5)
Monocytes Relative: 8.1 %
Neutro Abs: 2963 cells/uL (ref 1500–7800)
Neutrophils Relative %: 55.9 %
Platelets: 219 10*3/uL (ref 140–400)
RBC: 4.89 10*6/uL (ref 3.80–5.10)
RDW: 11.9 % (ref 11.0–15.0)
Total Lymphocyte: 34.3 %
WBC: 5.3 10*3/uL (ref 3.8–10.8)

## 2020-05-05 LAB — LIPID PANEL
Cholesterol: 270 mg/dL — ABNORMAL HIGH (ref <200)
HDL: 79 mg/dL (ref >=50)
LDL Cholesterol (Calc): 175 mg/dL (calc) — ABNORMAL HIGH
Non-HDL Cholesterol (Calc): 191 mg/dL (calc) — ABNORMAL HIGH (ref <130)
Total CHOL/HDL Ratio: 3.4 (calc) (ref <5.0)
Triglycerides: 62 mg/dL (ref <150)

## 2020-05-05 LAB — HEPATITIS C ANTIBODY
Hepatitis C Ab: NONREACTIVE
SIGNAL TO CUT-OFF: 0.01 (ref <1.00)

## 2020-05-05 LAB — TSH: TSH: 2.95 mIU/L

## 2020-09-12 ENCOUNTER — Encounter: Payer: Self-pay | Admitting: Family Medicine

## 2020-09-12 DIAGNOSIS — I10 Essential (primary) hypertension: Secondary | ICD-10-CM

## 2020-09-12 MED ORDER — AMLODIPINE BESYLATE 10 MG PO TABS: ORAL_TABLET | ORAL | 0 refills | Status: DC

## 2020-09-12 MED ORDER — METOPROLOL SUCCINATE ER 100 MG PO TB24: ORAL_TABLET | ORAL | 0 refills | Status: DC

## 2021-01-13 ENCOUNTER — Other Ambulatory Visit: Payer: Self-pay | Admitting: Family Medicine

## 2021-01-13 DIAGNOSIS — I10 Essential (primary) hypertension: Secondary | ICD-10-CM

## 2021-01-29 ENCOUNTER — Other Ambulatory Visit (HOSPITAL_BASED_OUTPATIENT_CLINIC_OR_DEPARTMENT_OTHER): Payer: Self-pay | Admitting: Family Medicine

## 2021-01-29 DIAGNOSIS — Z1231 Encounter for screening mammogram for malignant neoplasm of breast: Secondary | ICD-10-CM

## 2021-04-18 ENCOUNTER — Other Ambulatory Visit: Payer: Self-pay | Admitting: Family Medicine

## 2021-04-18 DIAGNOSIS — I10 Essential (primary) hypertension: Secondary | ICD-10-CM

## 2021-04-20 ENCOUNTER — Other Ambulatory Visit: Payer: Self-pay

## 2021-04-20 ENCOUNTER — Ambulatory Visit (HOSPITAL_BASED_OUTPATIENT_CLINIC_OR_DEPARTMENT_OTHER)
Admission: RE | Admit: 2021-04-20 | Discharge: 2021-04-20 | Disposition: A | Discharge status: Home / Self Care | Payer: BC Managed Care – PPO | Source: Ambulatory Visit | Attending: Family Medicine | Admitting: Family Medicine

## 2021-04-20 ENCOUNTER — Encounter (HOSPITAL_BASED_OUTPATIENT_CLINIC_OR_DEPARTMENT_OTHER): Payer: Self-pay

## 2021-04-20 DIAGNOSIS — Z1231 Encounter for screening mammogram for malignant neoplasm of breast: Secondary | ICD-10-CM | POA: Insufficient documentation

## 2021-05-07 ENCOUNTER — Other Ambulatory Visit: Payer: Self-pay

## 2021-05-08 ENCOUNTER — Ambulatory Visit (INDEPENDENT_AMBULATORY_CARE_PROVIDER_SITE_OTHER): Payer: BC Managed Care – PPO | Admitting: Family Medicine

## 2021-05-08 ENCOUNTER — Other Ambulatory Visit (HOSPITAL_COMMUNITY)
Admission: RE | Admit: 2021-05-08 | Discharge: 2021-05-08 | Disposition: A | Discharge status: Home / Self Care | Payer: BC Managed Care – PPO | Source: Ambulatory Visit | Attending: Family Medicine | Admitting: Family Medicine

## 2021-05-08 ENCOUNTER — Telehealth: Payer: Self-pay | Admitting: Family Medicine

## 2021-05-08 ENCOUNTER — Encounter: Payer: Self-pay | Admitting: Family Medicine

## 2021-05-08 VITALS — BP 124/84 | HR 65 | Temp 98.2°F | Resp 18 | Ht 65.0 in | Wt 135.0 lb

## 2021-05-08 DIAGNOSIS — Z0000 Encounter for general adult medical examination without abnormal findings: Secondary | ICD-10-CM

## 2021-05-08 DIAGNOSIS — I10 Essential (primary) hypertension: Secondary | ICD-10-CM | POA: Diagnosis not present

## 2021-05-08 DIAGNOSIS — Z1211 Encounter for screening for malignant neoplasm of colon: Secondary | ICD-10-CM

## 2021-05-08 LAB — COMPREHENSIVE METABOLIC PANEL
ALT: 12 U/L (ref 0–35)
AST: 25 U/L (ref 0–37)
Albumin: 4.6 g/dL (ref 3.5–5.2)
Alkaline Phosphatase: 99 U/L (ref 39–117)
BUN: 8 mg/dL (ref 6–23)
CO2: 29 mEq/L (ref 19–32)
Calcium: 9.7 mg/dL (ref 8.4–10.5)
Chloride: 103 mEq/L (ref 96–112)
Creatinine, Ser: 0.88 mg/dL (ref 0.40–1.20)
GFR: 74.66 mL/min (ref >60.00)
Glucose, Bld: 76 mg/dL (ref 70–99)
Potassium: 4.5 mEq/L (ref 3.5–5.1)
Sodium: 138 mEq/L (ref 135–145)
Total Bilirubin: 0.7 mg/dL (ref 0.2–1.2)
Total Protein: 8.1 g/dL (ref 6.0–8.3)

## 2021-05-08 LAB — LIPID PANEL
Cholesterol: 258 mg/dL — ABNORMAL HIGH (ref 0–200)
HDL: 70.9 mg/dL (ref >39.00)
LDL Cholesterol: 178 mg/dL — ABNORMAL HIGH (ref 0–99)
NonHDL: 187.25
Total CHOL/HDL Ratio: 4
Triglycerides: 48 mg/dL (ref 0.0–149.0)
VLDL: 9.6 mg/dL (ref 0.0–40.0)

## 2021-05-08 LAB — CBC WITH DIFFERENTIAL/PLATELET
Basophils Absolute: 0.1 10*3/uL (ref 0.0–0.1)
Basophils Relative: 1.4 % (ref 0.0–3.0)
Eosinophils Absolute: 0.1 10*3/uL (ref 0.0–0.7)
Eosinophils Relative: 1.4 % (ref 0.0–5.0)
HCT: 40.4 % (ref 36.0–46.0)
Hemoglobin: 12.7 g/dL (ref 12.0–15.0)
Lymphocytes Relative: 39.4 % (ref 12.0–46.0)
Lymphs Abs: 1.7 10*3/uL (ref 0.7–4.0)
MCHC: 31.4 g/dL (ref 30.0–36.0)
MCV: 85.5 fl (ref 78.0–100.0)
Monocytes Absolute: 0.3 10*3/uL (ref 0.1–1.0)
Monocytes Relative: 8 % (ref 3.0–12.0)
Neutro Abs: 2.1 10*3/uL (ref 1.4–7.7)
Neutrophils Relative %: 49.8 % (ref 43.0–77.0)
Platelets: 240 10*3/uL (ref 150.0–400.0)
RBC: 4.73 Mil/uL (ref 3.87–5.11)
RDW: 13 % (ref 11.5–15.5)
WBC: 4.3 10*3/uL (ref 4.0–10.5)

## 2021-05-08 LAB — TSH: TSH: 1.39 u[IU]/mL (ref 0.35–5.50)

## 2021-05-08 NOTE — Telephone Encounter (Signed)
Spoke with the lab. Sending cytology back due to label on bottle not matching req.

## 2021-05-08 NOTE — Progress Notes (Signed)
Subjective:   By signing my name below, I, Zite Okoli, attest that this documentation has been prepared under the direction and in the presence of Donato Schultz, DO. 05/08/2021    Patient ID: Margaret Gibson, female    DOB: 04-24-1966, 55 y.o.   MRN: [id number]  Chief Complaint  Patient presents with   Annual Exam    Pt states fasting     HPI Patient is in today for a comprehensive physical exam.  She reports she is doing well at this time.  Her blood pressure is stable at this visit. She checks her blood pressure regularly at home. BP Readings from Last 3 Encounters:  05/08/21 124/84  05/02/20 (!) 146/88  04/20/19 (!) 150/90    She stopped getting her menstrual cycles about 4 years ago. She uses generic medication to manage her hot flashes.  She denies fever, hearing loss, ear pain,congestion, sinus pain, sore throat, eye pain, chest pain, palpitations, cough, shortness of breath, wheezing, nausea. vomiting, diarrhea, constipation, blood in stool, dysuria,frequency, hematuria and headaches.   She has 4 Covid-19 vaccines at this time and is going to get the 3rd booster vaccine soon. She is willing to think about getting the shingles vaccine.  Her father passed away this year at 70 years old and died of natural causes.  History reviewed. No pertinent past medical history.  Past Surgical History:  Procedure Laterality Date   LEG SURGERY  08/2008   rod implant    Family History  Problem Relation Age of Onset   Hyperlipidemia Mother    Hypertension Father    Stroke Father    Cancer Sister 65       breast    Social History   Socioeconomic History   Marital status: Married    Spouse name: Not on file   Number of children: Not on file   Years of education: Not on file   Highest education level: Not on file  Occupational History    Employer: [employer]    Comment: [employer]  Tobacco Use   Smoking status: Never   Smokeless tobacco: Never  Substance and  Sexual Activity   Alcohol use: No   Drug use: No   Sexual activity: Yes    Partners: Male  Other Topics Concern   Not on file  Social History Narrative   Exercise -- pedometer 10.000 steps   Social Determinants of Health   Financial Resource Strain: Not on file  Food Insecurity: Not on file  Transportation Needs: Not on file  Physical Activity: Not on file  Stress: Not on file  Social Connections: Not on file  Intimate Partner Violence: Not on file    Outpatient Medications Prior to Visit  Medication Sig Dispense Refill   amLODipine (NORVASC) 10 MG tablet TAKE 1 TABLET(10 MG) BY MOUTH DAILY 90 tablet 1   Flaxseed, Linseed, (FLAX SEED OIL PO) Take by mouth daily.       GARLIC PO Take by mouth.     metoprolol succinate (TOPROL-XL) 100 MG 24 hr tablet Take with or immediately following a meal. 135 tablet 0   Multiple Vitamins-Minerals (HAIR/SKIN/NAILS) TABS Take by mouth daily.       Omega-3 Fatty Acids (FISH OIL TRIPLE STRENGTH) 1400 MG CAPS Take by mouth daily.       No facility-administered medications prior to visit.    No Known Allergies  Review of Systems  Constitutional:  Negative for fever.  HENT:  Negative for congestion,  ear pain, hearing loss, sinus pain and sore throat.   Eyes:  Negative for blurred vision and pain.  Respiratory:  Negative for cough, sputum production, shortness of breath and wheezing.   Cardiovascular:  Negative for chest pain and palpitations.  Gastrointestinal:  Negative for blood in stool, constipation, diarrhea, nausea and vomiting.  Genitourinary:  Negative for dysuria, frequency, hematuria and urgency.  Musculoskeletal:  Negative for back pain, falls and myalgias.  Neurological:  Negative for dizziness, sensory change, loss of consciousness, weakness and headaches.  Endo/Heme/Allergies:  Negative for environmental allergies. Does not bruise/bleed easily.  Psychiatric/Behavioral:  Negative for depression and suicidal ideas. The patient is  not nervous/anxious and does not have insomnia.       Objective:    Physical Exam Constitutional:      General: She is not in acute distress.    Appearance: Normal appearance. She is not ill-appearing.  HENT:     Head: Normocephalic and atraumatic.     Right Ear: Tympanic membrane, ear canal and external ear normal.     Left Ear: Tympanic membrane, ear canal and external ear normal.  Eyes:     Extraocular Movements: Extraocular movements intact.     Pupils: Pupils are equal, round, and reactive to light.  Cardiovascular:     Rate and Rhythm: Normal rate and regular rhythm.     Pulses: Normal pulses.     Heart sounds: Normal heart sounds. No murmur heard.   No gallop.  Pulmonary:     Effort: Pulmonary effort is normal. No respiratory distress.     Breath sounds: Normal breath sounds. No wheezing, rhonchi or rales.  Chest:  Breasts:    Right: Normal.     Left: Normal.  Abdominal:     General: Bowel sounds are normal. There is no distension.     Palpations: Abdomen is soft. There is no mass.     Tenderness: There is no abdominal tenderness. There is no guarding or rebound.     Hernia: No hernia is present.  Genitourinary:    Vagina: Normal.     Cervix: No cervical motion tenderness, discharge, friability, lesion, erythema, cervical bleeding or eversion.     Uterus: Normal. Not deviated, not enlarged, not fixed, not tender and no uterine prolapse.      Adnexa: Right adnexa normal and left adnexa normal.     Rectum: Normal. Guaiac result negative. No mass, tenderness, anal fissure, external hemorrhoid or internal hemorrhoid. Normal anal tone.     Comments: Pap smear done at this visit Musculoskeletal:     Cervical back: Normal range of motion and neck supple.  Lymphadenopathy:     Cervical: No cervical adenopathy.  Skin:    General: Skin is warm and dry.  Neurological:     Mental Status: She is alert and oriented to person, place, and time.  Psychiatric:        Behavior:  Behavior normal.    BP 124/84 (BP Location: Left Arm, Patient Position: Sitting, Cuff Size: Normal)   Pulse 65   Temp 98.2 F (36.8 C) (Oral)   Resp 18   Ht 5\' 5"  (1.651 m)   Wt 135 lb (61.2 kg)   LMP 04/14/2018   SpO2 99%   BMI 22.47 kg/m  Wt Readings from Last 3 Encounters:  05/08/21 135 lb (61.2 kg)  05/02/20 142 lb (64.4 kg)  04/20/19 134 lb (60.8 kg)    Diabetic Foot Exam - Simple   No data filed  Lab Results  Component Value Date   WBC 5.3 05/02/2020   HGB 13.6 05/02/2020   HCT 41.8 05/02/2020   PLT 219 05/02/2020   GLUCOSE 97 05/02/2020   CHOL 270 (H) 05/02/2020   TRIG 62 05/02/2020   HDL 79 05/02/2020   LDLDIRECT 136.5 10/31/2012   LDLCALC 175 (H) 05/02/2020   ALT 13 05/02/2020   AST 24 05/02/2020   NA 140 05/02/2020   K 4.4 05/02/2020   CL 102 05/02/2020   CREATININE 0.92 05/02/2020   BUN 14 05/02/2020   CO2 25 05/02/2020   TSH 2.95 05/02/2020    Lab Results  Component Value Date   TSH 2.95 05/02/2020   Lab Results  Component Value Date   WBC 5.3 05/02/2020   HGB 13.6 05/02/2020   HCT 41.8 05/02/2020   MCV 85.5 05/02/2020   PLT 219 05/02/2020   Lab Results  Component Value Date   NA 140 05/02/2020   K 4.4 05/02/2020   CO2 25 05/02/2020   GLUCOSE 97 05/02/2020   BUN 14 05/02/2020   CREATININE 0.92 05/02/2020   BILITOT 0.6 05/02/2020   ALKPHOS 70 04/20/2019   AST 24 05/02/2020   ALT 13 05/02/2020   PROT 8.5 (H) 05/02/2020   ALBUMIN 4.6 04/20/2019   CALCIUM 10.3 05/02/2020   GFR 81.63 04/20/2019   Lab Results  Component Value Date   CHOL 270 (H) 05/02/2020   Lab Results  Component Value Date   HDL 79 05/02/2020   Lab Results  Component Value Date   LDLCALC 175 (H) 05/02/2020   Lab Results  Component Value Date   TRIG 62 05/02/2020   Lab Results  Component Value Date   CHOLHDL 3.4 05/02/2020   No results found for: HGBA1C      Mammogram: Last completed on 04/20/2021. Results were normal. Repeat in  1 year. Pap  Smear: Not completed  Colonoscopy: Last completed on 04/20/2018. Results were normal. Repeat in 3 years. Due  Assessment & Plan:   Problem List Items Addressed This Visit   None Visit Diagnoses     Preventative health care    -  Primary   Relevant Orders   Cytology - PAP( [place])   CBC with Differential/Platelet   Comprehensive metabolic panel   Lipid panel   TSH   Primary hypertension       Relevant Orders   CBC with Differential/Platelet   Comprehensive metabolic panel   Lipid panel   TSH   Colon cancer screening       Relevant Orders   Ambulatory referral to Gastroenterology        No orders of the defined types were placed in this encounter.   I,Zite Okoli,acting as a Neurosurgeon for [employer], DO.,have documented all relevant documentation on the behalf of Donato Schultz, DO,as directed by  Donato Schultz, DO while in the presence of Donato Schultz, DO.   I, Donato Schultz, DO., personally preformed the services described in this documentation.  All medical record entries made by the scribe were at my direction and in my presence.  I have reviewed the chart and discharge instructions (if applicable) and agree that the record reflects my personal performance and is accurate and complete. 05/08/2021

## 2021-05-08 NOTE — Patient Instructions (Signed)
Preventive Care 40-54 Years Old, Female Preventive care refers to lifestyle choices and visits with your health care provider that can promote health and wellness. This includes: A yearly physical exam. This is also called an annual wellness visit. Regular dental and eye exams. Immunizations. Screening for certain conditions. Healthy lifestyle choices, such as: Eating a healthy diet. Getting regular exercise. Not using drugs or products that contain nicotine and tobacco. Limiting alcohol use. What can I expect for my preventive care visit? Physical exam Your health care provider will check your: Height and weight. These may be used to calculate your BMI (body mass index). BMI is a measurement that tells if you are at a healthy weight. Heart rate and blood pressure. Body temperature. Skin for abnormal spots. Counseling Your health care provider may ask you questions about your: Past medical problems. Family's medical history. Alcohol, tobacco, and drug use. Emotional well-being. Home life and relationship well-being. Sexual activity. Diet, exercise, and sleep habits. Work and work environment. Access to firearms. Method of birth control. Menstrual cycle. Pregnancy history. What immunizations do I need? Vaccines are usually given at various ages, according to a schedule. Your health care provider will recommend vaccines for you based on your age, medical history, and lifestyle or other factors, such as travel or where you work. What tests do I need? Blood tests Lipid and cholesterol levels. These may be checked every 5 years, or more often if you are over 50 years old. Hepatitis C test. Hepatitis B test. Screening Lung cancer screening. You may have this screening every year starting at age 55 if you have a 30-pack-year history of smoking and currently smoke or have quit within the past 15 years. Colorectal cancer screening. All adults should have this screening starting at  age 50 and continuing until age 75. Your health care provider may recommend screening at age 45 if you are at increased risk. You will have tests every 1-10 years, depending on your results and the type of screening test. Diabetes screening. This is done by checking your blood sugar (glucose) after you have not eaten for a while (fasting). You may have this done every 1-3 years. Mammogram. This may be done every 1-2 years. Talk with your health care provider about when you should start having regular mammograms. This may depend on whether you have a family history of breast cancer. BRCA-related cancer screening. This may be done if you have a family history of breast, ovarian, tubal, or peritoneal cancers. Pelvic exam and Pap test. This may be done every 3 years starting at age 21. Starting at age 30, this may be done every 5 years if you have a Pap test in combination with an HPV test. Other tests STD (sexually transmitted disease) testing, if you are at risk. Bone density scan. This is done to screen for osteoporosis. You may have this scan if you are at high risk for osteoporosis. Talk with your health care provider about your test results, treatment options, and if necessary, the need for more tests. Follow these instructions at home: Eating and drinking  Eat a diet that includes fresh fruits and vegetables, whole grains, lean protein, and low-fat dairy products. Take vitamin and mineral supplements as recommended by your health care provider. Do not drink alcohol if: Your health care provider tells you not to drink. You are pregnant, may be pregnant, or are planning to become pregnant. If you drink alcohol: Limit how much you have to 0-1 drink a day. Be   aware of how much alcohol is in your drink. In the [place] one drink equals one 12 oz bottle of beer (355 mL), one 5 oz glass of wine (148 mL), or one 1 oz glass of hard liquor (44 mL). Lifestyle Take daily care of your teeth and  gums. Brush your teeth every morning and night with fluoride toothpaste. Floss one time each day. Stay active. Exercise for at least 30 minutes 5 or more days each week. Do not use any products that contain nicotine or tobacco, such as cigarettes, e-cigarettes, and chewing tobacco. If you need help quitting, ask your health care provider. Do not use drugs. If you are sexually active, practice safe sex. Use a condom or other form of protection to prevent STIs (sexually transmitted infections). If you do not wish to become pregnant, use a form of birth control. If you plan to become pregnant, see your health care provider for a prepregnancy visit. If told by your health care provider, take low-dose aspirin daily starting at age 63. Find healthy ways to cope with stress, such as: Meditation, yoga, or listening to music. Journaling. Talking to a trusted person. Spending time with friends and family. Safety Always wear your seat belt while driving or riding in a vehicle. Do not drive: If you have been drinking alcohol. Do not ride with someone who has been drinking. When you are tired or distracted. While texting. Wear a helmet and other protective equipment during sports activities. If you have firearms in your house, make sure you follow all gun safety procedures. What's next? Visit your health care provider once a year for an annual wellness visit. Ask your health care provider how often you should have your eyes and teeth checked. Stay up to date on all vaccines. This information is not intended to replace advice given to you by your health care provider. Make sure you discuss any questions you have with your health care provider. Document Revised: 09/19/2020 Document Reviewed: 03/23/2018 Elsevier Patient Education  2022 [employer].

## 2021-05-08 NOTE — Telephone Encounter (Signed)
Margaret DandyPatrcia Gibson [facility] Cytology called : [phone]  Needing to discuss retuning a specimen

## 2021-05-13 LAB — CYTOLOGY - PAP: Diagnosis: NEGATIVE

## 2021-05-15 ENCOUNTER — Telehealth: Payer: Self-pay | Admitting: Family Medicine

## 2021-05-15 ENCOUNTER — Ambulatory Visit: Payer: BC Managed Care – PPO | Attending: Internal Medicine

## 2021-05-15 DIAGNOSIS — Z23 Encounter for immunization: Secondary | ICD-10-CM

## 2021-05-15 NOTE — Telephone Encounter (Signed)
Noted  

## 2021-05-15 NOTE — Progress Notes (Signed)
   Covid-19 Vaccination Clinic  Name:  Tam Savoia    MRN: [id number] DOB: May 23, 1966  05/15/2021  Ms. Danziger was observed post Covid-19 immunization for 15 minutes without incident. She was provided with Vaccine Information Sheet and instruction to access the V-Safe system.   Ms. Mattes was instructed to call 911 with any severe reactions post vaccine: Difficulty breathing  Swelling of face and throat  A fast heartbeat  A bad rash all over body  Dizziness and weakness   Immunizations Administered     Name Date Dose VIS Date Route   Pfizer Covid-19 Vaccine Bivalent Booster 05/15/2021  1:26 PM 0.3 mL 03/25/2021 Intramuscular   Manufacturer: [employer], [employer]   Lot: [id number]   NDC: [phone]

## 2021-05-15 NOTE — Telephone Encounter (Signed)
Pt dropped off copy of her Covid Vaccination card- pt would like to have info updated on her chart. Document put at front office tray under providers name.

## 2021-05-21 ENCOUNTER — Encounter: Payer: BC Managed Care – PPO | Admitting: Family Medicine

## 2021-06-12 ENCOUNTER — Other Ambulatory Visit (HOSPITAL_BASED_OUTPATIENT_CLINIC_OR_DEPARTMENT_OTHER): Payer: Self-pay

## 2021-06-12 MED ORDER — PFIZER COVID-19 VAC BIVALENT 30 MCG/0.3ML IM SUSP
INTRAMUSCULAR | 0 refills | Status: DC
  Filled 2021-06-12: qty 0.3, 1d supply, fill #0

## 2021-12-09 ENCOUNTER — Other Ambulatory Visit (HOSPITAL_BASED_OUTPATIENT_CLINIC_OR_DEPARTMENT_OTHER): Payer: Self-pay | Admitting: Family Medicine

## 2021-12-09 DIAGNOSIS — Z1231 Encounter for screening mammogram for malignant neoplasm of breast: Secondary | ICD-10-CM

## 2022-04-06 IMAGING — MG MM DIGITAL SCREENING BILAT W/ TOMO AND CAD
8 series · 9 of 24 positions shown · non-contrast
Comparison: Previous exam(s).

CLINICAL DATA: Screening.

EXAM:
DIGITAL SCREENING BILATERAL MAMMOGRAM WITH TOMOSYNTHESIS AND CAD
TECHNIQUE: Bilateral screening digital craniocaudal and mediolateral oblique
mammograms were obtained. Bilateral screening digital breast
tomosynthesis was performed. The images were evaluated with
computer-aided detection.

[L CC synth-2D]
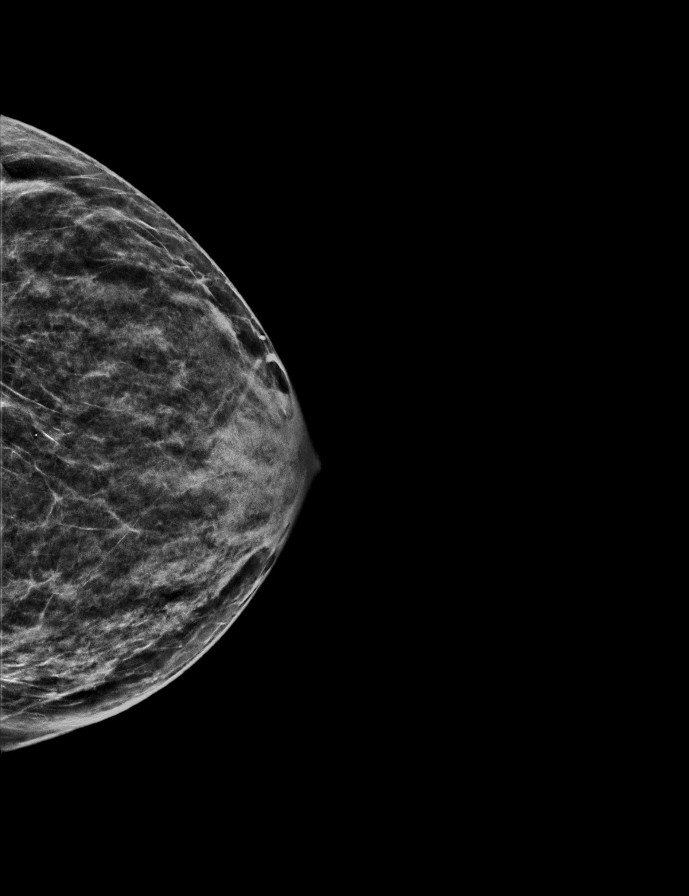

[R MLO synth-2D]
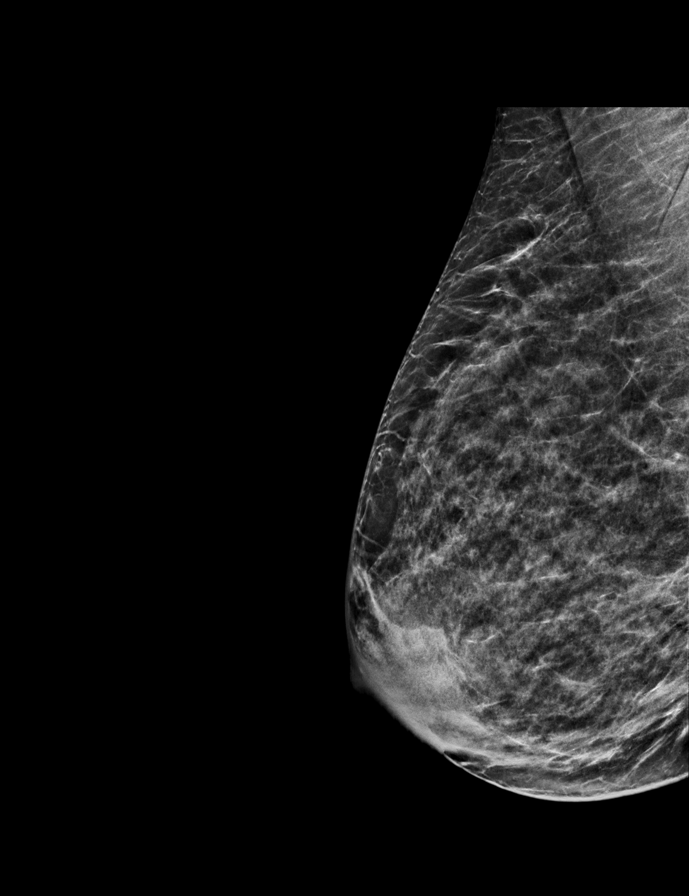

[R CC synth-2D]
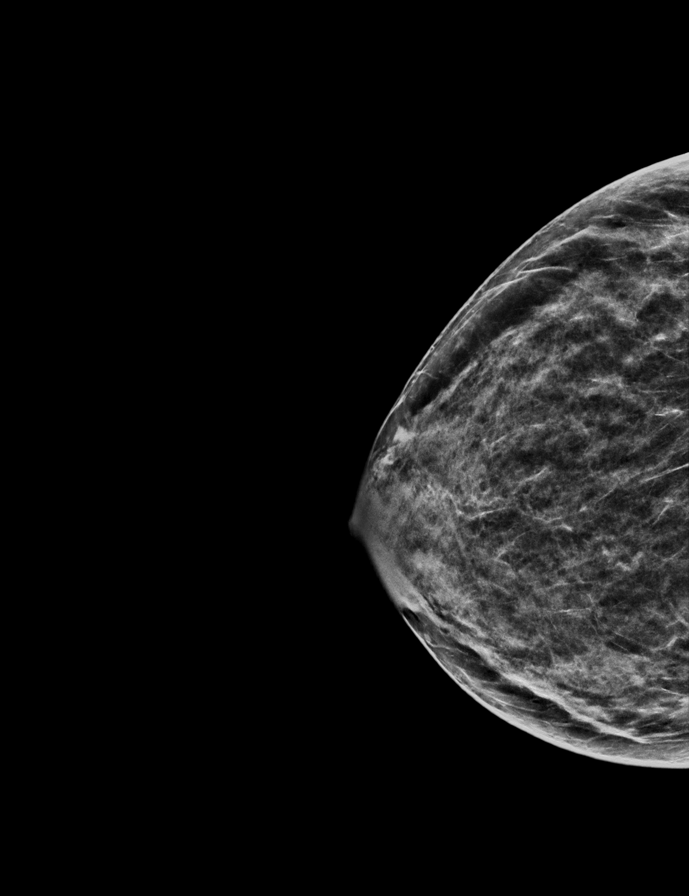

[L MLO synth-2D]
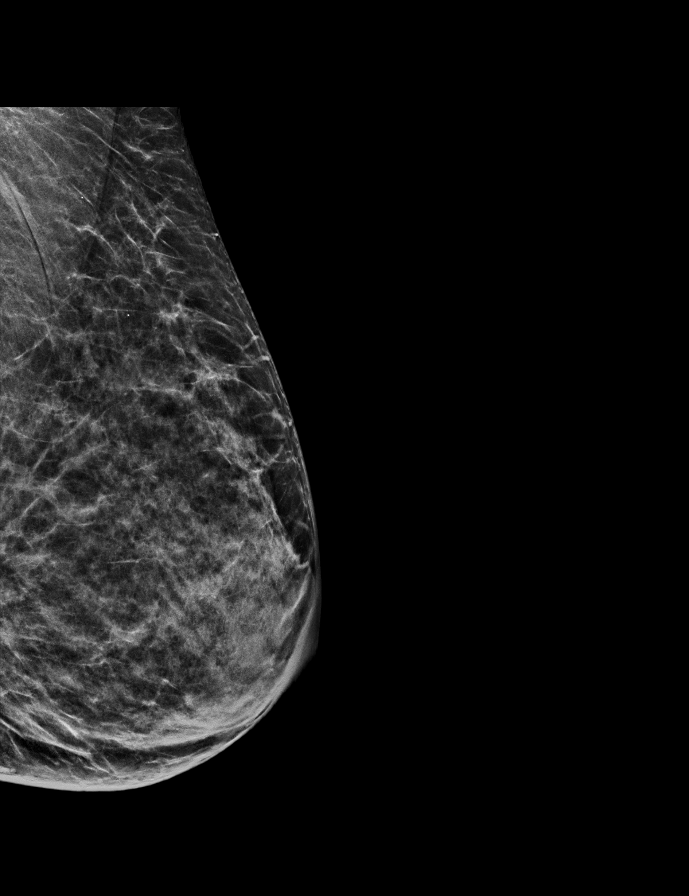

[R CC tomo · 2 of 52 frames shown]
[frame 17/52]
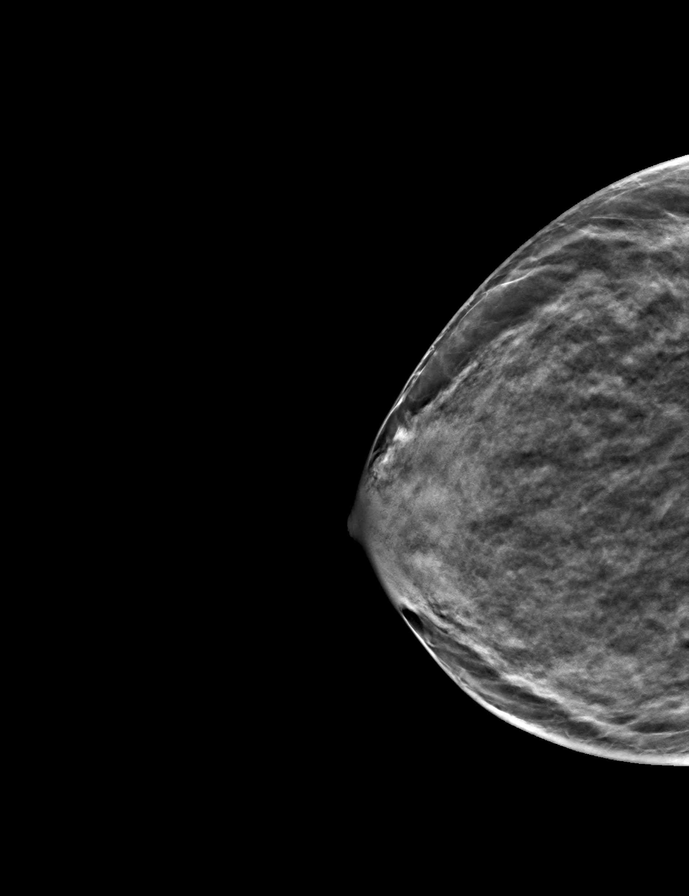
[frame 27/52]
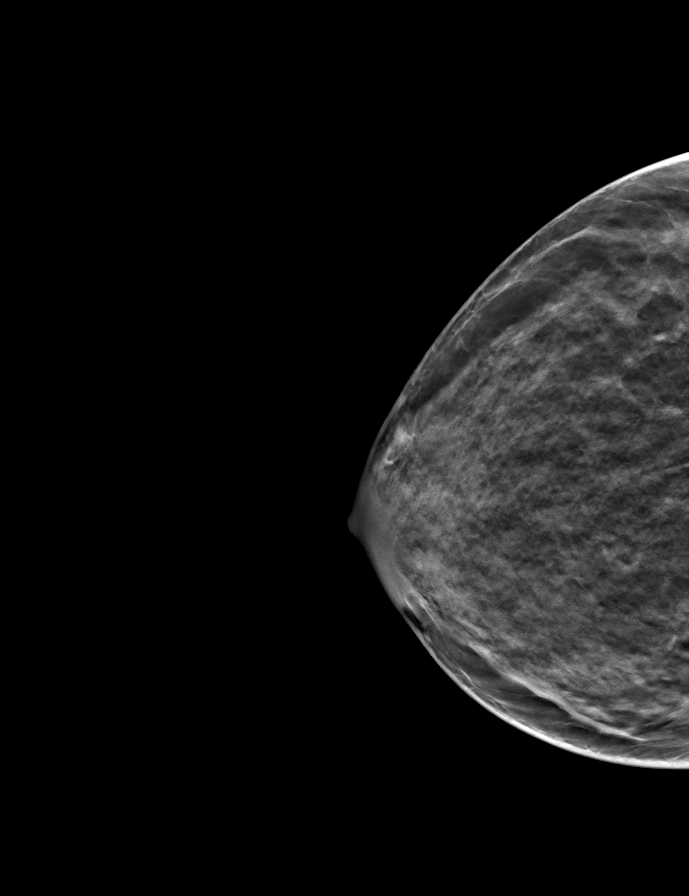

[R MLO tomo · tomo slice 25/50.0]
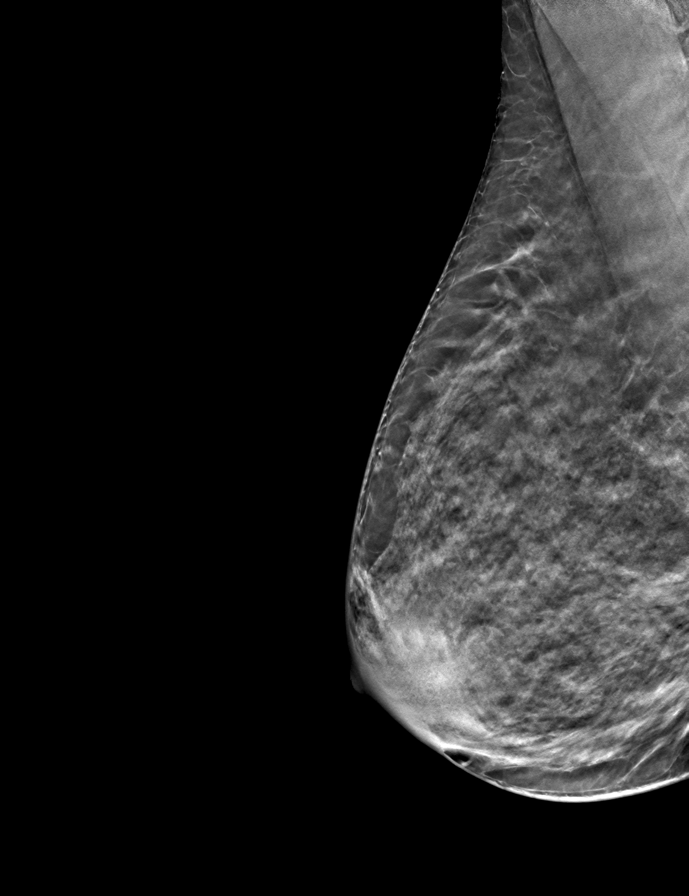

[L CC tomo · tomo slice 25/48.0]
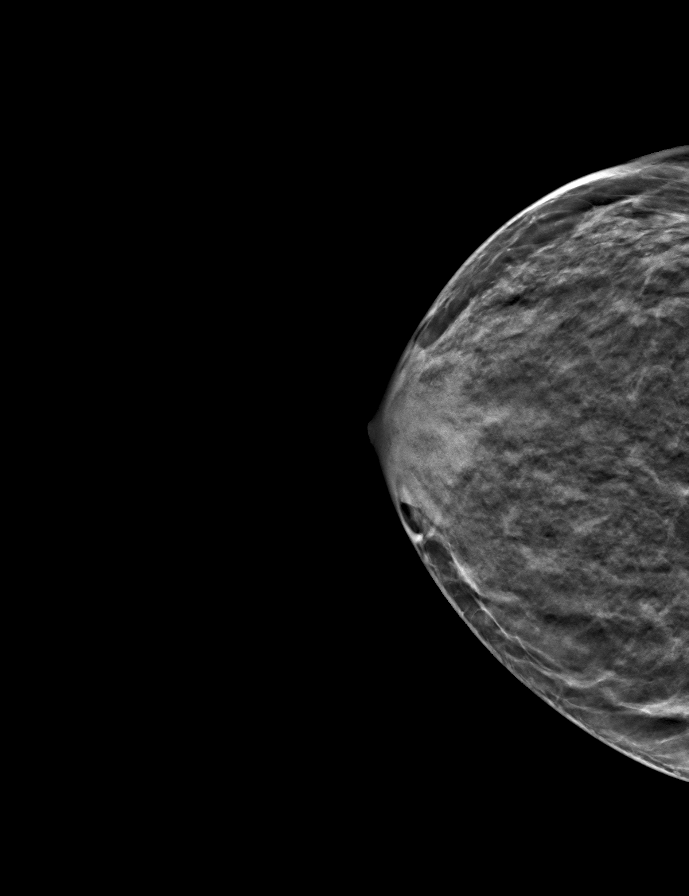

[L MLO tomo · tomo slice 25/48.0]
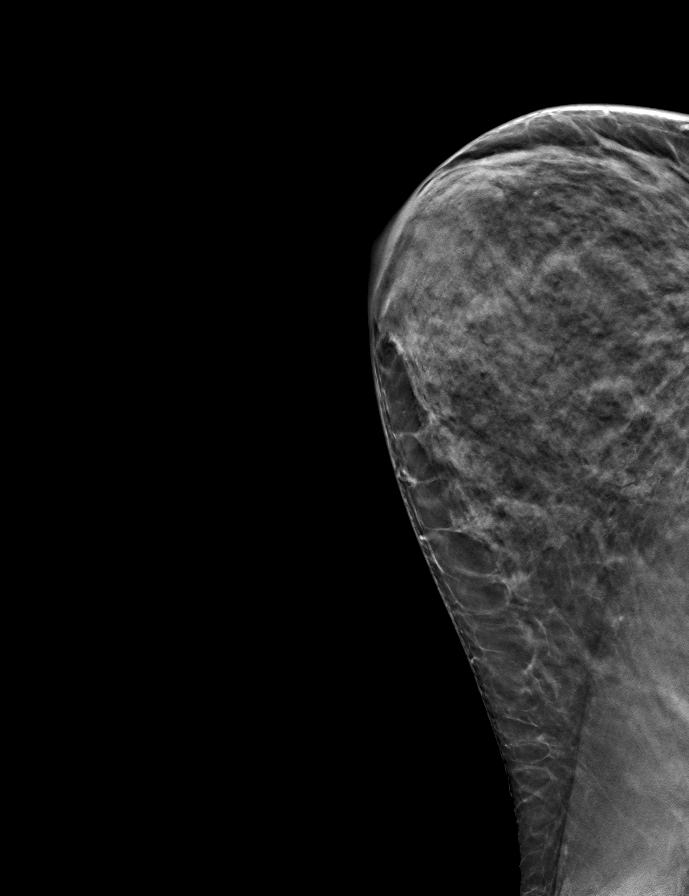

[9 of 24 positions shown; findings below may reference images not displayed]

ACR Breast Density Category c: The breast tissue is heterogeneously
dense, which may obscure small masses.
FINDINGS: There are no findings suspicious for malignancy.
IMPRESSION: No mammographic evidence of malignancy. A result letter of this
screening mammogram will be mailed directly to the patient.

RECOMMENDATION:
Screening mammogram in one year. (Code:Q3-W-BC3)

BI-RADS CATEGORY  1: Negative.

## 2022-04-27 ENCOUNTER — Encounter (HOSPITAL_BASED_OUTPATIENT_CLINIC_OR_DEPARTMENT_OTHER): Payer: Self-pay

## 2022-04-27 ENCOUNTER — Ambulatory Visit (HOSPITAL_BASED_OUTPATIENT_CLINIC_OR_DEPARTMENT_OTHER)
Admission: RE | Admit: 2022-04-27 | Discharge: 2022-04-27 | Disposition: A | Discharge status: Home / Self Care | Payer: BC Managed Care – PPO | Source: Ambulatory Visit | Attending: Family Medicine | Admitting: Family Medicine

## 2022-04-27 DIAGNOSIS — Z1231 Encounter for screening mammogram for malignant neoplasm of breast: Secondary | ICD-10-CM | POA: Diagnosis not present

## 2022-05-18 ENCOUNTER — Other Ambulatory Visit: Payer: Self-pay | Admitting: Family Medicine

## 2022-05-18 ENCOUNTER — Encounter: Payer: BC Managed Care – PPO | Admitting: Family Medicine

## 2022-05-18 ENCOUNTER — Encounter: Payer: Self-pay | Admitting: Family Medicine

## 2022-05-18 ENCOUNTER — Ambulatory Visit (INDEPENDENT_AMBULATORY_CARE_PROVIDER_SITE_OTHER): Payer: BC Managed Care – PPO | Admitting: Family Medicine

## 2022-05-18 VITALS — BP 142/98 | HR 74 | Temp 98.6°F | Resp 18 | Ht 65.0 in | Wt 134.6 lb

## 2022-05-18 DIAGNOSIS — Z0000 Encounter for general adult medical examination without abnormal findings: Secondary | ICD-10-CM

## 2022-05-18 DIAGNOSIS — Z1211 Encounter for screening for malignant neoplasm of colon: Secondary | ICD-10-CM

## 2022-05-18 DIAGNOSIS — I10 Essential (primary) hypertension: Secondary | ICD-10-CM | POA: Diagnosis not present

## 2022-05-18 LAB — CBC WITH DIFFERENTIAL/PLATELET
Basophils Absolute: 0 10*3/uL (ref 0.0–0.1)
Basophils Relative: 0.9 % (ref 0.0–3.0)
Eosinophils Absolute: 0 10*3/uL (ref 0.0–0.7)
Eosinophils Relative: 0.8 % (ref 0.0–5.0)
HCT: 39.4 % (ref 36.0–46.0)
Hemoglobin: 12.5 g/dL (ref 12.0–15.0)
Lymphocytes Relative: 34.3 % (ref 12.0–46.0)
Lymphs Abs: 1.8 10*3/uL (ref 0.7–4.0)
MCHC: 31.7 g/dL (ref 30.0–36.0)
MCV: 85.1 fl (ref 78.0–100.0)
Monocytes Absolute: 0.4 10*3/uL (ref 0.1–1.0)
Monocytes Relative: 7.2 % (ref 3.0–12.0)
Neutro Abs: 3 10*3/uL (ref 1.4–7.7)
Neutrophils Relative %: 56.8 % (ref 43.0–77.0)
Platelets: 242 10*3/uL (ref 150.0–400.0)
RBC: 4.63 Mil/uL (ref 3.87–5.11)
RDW: 12.8 % (ref 11.5–15.5)
WBC: 5.2 10*3/uL (ref 4.0–10.5)

## 2022-05-18 LAB — LIPID PANEL
Cholesterol: 257 mg/dL — ABNORMAL HIGH (ref 0–200)
HDL: 72.6 mg/dL (ref >39.00)
LDL Cholesterol: 176 mg/dL — ABNORMAL HIGH (ref 0–99)
NonHDL: 184.74
Total CHOL/HDL Ratio: 4
Triglycerides: 45 mg/dL (ref 0.0–149.0)
VLDL: 9 mg/dL (ref 0.0–40.0)

## 2022-05-18 LAB — COMPREHENSIVE METABOLIC PANEL
ALT: 14 U/L (ref 0–35)
AST: 25 U/L (ref 0–37)
Albumin: 4.7 g/dL (ref 3.5–5.2)
Alkaline Phosphatase: 79 U/L (ref 39–117)
BUN: 14 mg/dL (ref 6–23)
CO2: 29 mEq/L (ref 19–32)
Calcium: 9.9 mg/dL (ref 8.4–10.5)
Chloride: 103 mEq/L (ref 96–112)
Creatinine, Ser: 0.76 mg/dL (ref 0.40–1.20)
GFR: 88.38 mL/min (ref >60.00)
Glucose, Bld: 84 mg/dL (ref 70–99)
Potassium: 4.2 mEq/L (ref 3.5–5.1)
Sodium: 139 mEq/L (ref 135–145)
Total Bilirubin: 0.5 mg/dL (ref 0.2–1.2)
Total Protein: 8.3 g/dL (ref 6.0–8.3)

## 2022-05-18 LAB — TSH: TSH: 1.46 u[IU]/mL (ref 0.35–5.50)

## 2022-05-18 MED ORDER — AMLODIPINE BESYLATE 10 MG PO TABS: ORAL_TABLET | ORAL | 1 refills | Status: DC

## 2022-05-18 MED ORDER — METOPROLOL SUCCINATE ER 100 MG PO TB24: ORAL_TABLET | ORAL | 3 refills | Status: DC

## 2022-05-18 NOTE — Progress Notes (Signed)
Subjective:   By signing my name below, I, Roma Schanz, attest that this documentation has been prepared under the direction and in the presence of Roma Schanz, 05/18/2022.   Patient ID: Margaret Gibson, female    DOB: [birth year], 56 y.o.   MRN: [id number]  Chief Complaint  Patient presents with   Annual Exam    Pt states fasting     HPI Patient is in today for a comprehensive physical exam.  She denies new moles, itching, chills, fever, hearing loss, sinus pain, congestion, sore throat, cough and hemoptysis, chest pain, palpitations, wheezing, constipation, diarrhea, blood in stool, nausea and vomiting, dysuria, frequency, hematuria, myalgias and joint pain, depression, anxiety.  Refills Patient is receiving refills on 10 mg Norvasc,, Flaxseed oil po, Garlic po, 485 mg Toprol-xl, Omega 3 fatty acids, and Multiple vitamin minerals.  Hypertension Patients blood pressure is high this visit and she regularly checks it at home. BP Readings from Last 3 Encounters:  05/18/22 (!) 142/98  05/08/21 124/84  05/02/20 (!) 146/88   Pulse Readings from Last 3 Encounters:  05/18/22 74  05/08/21 65  05/02/20 81    Social history- No new surgical procedures. Immunizations- She is uninterested in receiving an influenza and shingles vaccines this visit. Pap smear last completed on 05/08/2021. Mammogram last completed on 04/27/2022. Exercise- Patient is regularly walking for exercise. Vision - She is UTD on vision care. Dental - She is UTD on dental care.   There are no preventive care reminders to display for this patient.   Past Medical History:  Diagnosis Date   HTN (hypertension)     Past Surgical History:  Procedure Laterality Date   LEG SURGERY  08/2008   rod implant    Family History  Problem Relation Age of Onset   Hyperlipidemia Mother    Hypertension Father    Stroke Father    Cancer Sister 49       breast    Social History   Socioeconomic  History   Marital status: Married    Spouse name: Not on file   Number of children: Not on file   Years of education: Not on file   Highest education level: Not on file  Occupational History    Employer: [employer]    Comment: [employer]  Tobacco Use   Smoking status: Never   Smokeless tobacco: Never  Substance and Sexual Activity   Alcohol use: No   Drug use: No   Sexual activity: Yes    Partners: Male  Other Topics Concern   Not on file  Social History Narrative   Exercise -- pedometer 10.000 steps   Social Determinants of Health   Financial Resource Strain: Not on file  Food Insecurity: Not on file  Transportation Needs: Not on file  Physical Activity: Not on file  Stress: Not on file  Social Connections: Not on file  Intimate Partner Violence: Not on file    Outpatient Medications Prior to Visit  Medication Sig Dispense Refill   Flaxseed, Linseed, (FLAX SEED OIL PO) Take by mouth daily.       GARLIC PO Take by mouth.     Multiple Vitamins-Minerals (HAIR/SKIN/NAILS) TABS Take by mouth daily.       Omega-3 Fatty Acids (FISH OIL TRIPLE STRENGTH) 1400 MG CAPS Take by mouth daily.       amLODipine (NORVASC) 10 MG tablet TAKE 1 TABLET(10 MG) BY MOUTH DAILY 90 tablet 1   metoprolol succinate (TOPROL-XL) 100  MG 24 hr tablet Take with or immediately following a meal. 135 tablet 0   COVID-19 mRNA bivalent vaccine, Pfizer, (PFIZER COVID-19 VAC BIVALENT) injection Inject into the muscle. (Patient not taking: Reported on 05/18/2022) 0.3 mL 0   No facility-administered medications prior to visit.    No Known Allergies  Review of Systems  Constitutional:  Negative for chills, fever and malaise/fatigue.  HENT:  Negative for congestion, sinus pain and sore throat.   Eyes:  Negative for blurred vision.  Respiratory:  Negative for cough, hemoptysis, shortness of breath and wheezing.   Cardiovascular:  Negative for chest pain, palpitations and leg swelling.  Gastrointestinal:   Negative for abdominal pain, blood in stool, constipation, diarrhea, nausea and vomiting.  Genitourinary:  Negative for dysuria, frequency and hematuria.  Musculoskeletal:  Negative for falls, joint pain and myalgias.  Skin:  Negative for itching and rash.       (-) new moles  Neurological:  Negative for dizziness, loss of consciousness and headaches.  Endo/Heme/Allergies:  Negative for environmental allergies.  Psychiatric/Behavioral:  Negative for depression. The patient is not nervous/anxious.        Objective:    Physical Exam Vitals and nursing note reviewed.  Constitutional:      General: She is not in acute distress.    Appearance: Normal appearance. She is well-developed. She is not ill-appearing.  HENT:     Head: Normocephalic and atraumatic.     Right Ear: Tympanic membrane, ear canal and external ear normal.     Left Ear: Tympanic membrane, ear canal and external ear normal.     Nose: Nose normal.  Eyes:     Extraocular Movements: Extraocular movements intact.     Pupils: Pupils are equal, round, and reactive to light.  Cardiovascular:     Rate and Rhythm: Normal rate and regular rhythm.     Heart sounds: Normal heart sounds. No murmur heard.    No gallop.  Pulmonary:     Effort: Pulmonary effort is normal. No respiratory distress.     Breath sounds: Normal breath sounds. No wheezing or rales.  Chest:     Chest wall: No tenderness.  Abdominal:     General: Bowel sounds are normal. There is no distension.     Palpations: Abdomen is soft.     Tenderness: There is no abdominal tenderness. There is no guarding.  Musculoskeletal:     Cervical back: Normal range of motion and neck supple.  Skin:    General: Skin is warm and dry.  Neurological:     Mental Status: She is alert and oriented to person, place, and time.  Psychiatric:        Behavior: Behavior normal.        Thought Content: Thought content normal.        Judgment: Judgment normal.     BP (!) 142/98  (BP Location: Left Arm, Patient Position: Sitting, Cuff Size: Normal)   Pulse 74   Temp 98.6 F (37 C) (Oral)   Resp 18   Ht 5\' 5"  (1.651 m)   Wt 134 lb 9.6 oz (61.1 kg)   LMP 04/14/2018   SpO2 99%   BMI 22.40 kg/m  Wt Readings from Last 3 Encounters:  05/18/22 134 lb 9.6 oz (61.1 kg)  05/08/21 135 lb (61.2 kg)  05/02/20 142 lb (64.4 kg)       Assessment & Plan:   Problem List Items Addressed This Visit  Unprioritized   Primary hypertension    Well controlled, no changes to meds. Encouraged heart healthy diet such as the DASH diet and exercise as tolerated.  Home bp 121/82      Relevant Medications   amLODipine (NORVASC) 10 MG tablet   metoprolol succinate (TOPROL-XL) 100 MG 24 hr tablet   Other Relevant Orders   CBC with Differential/Platelet   Comprehensive metabolic panel   Lipid panel   TSH   Preventative health care - Primary    ghm utd Check labs  See avs      Relevant Orders   CBC with Differential/Platelet   Comprehensive metabolic panel   Lipid panel   TSH   Other Visit Diagnoses     Essential hypertension       Relevant Medications   amLODipine (NORVASC) 10 MG tablet   metoprolol succinate (TOPROL-XL) 100 MG 24 hr tablet   Colon cancer screening       Relevant Orders   Ambulatory referral to Gastroenterology       Meds ordered this encounter  Medications   amLODipine (NORVASC) 10 MG tablet    Sig: 1 po qd    Dispense:  90 tablet    Refill:  1   metoprolol succinate (TOPROL-XL) 100 MG 24 hr tablet    Sig: Take with or immediately following a meal.    Dispense:  135 tablet    Refill:  3    I, Roma Schanz, personally preformed the services described in this documentation.  All medical record entries made by the scribe were at my direction and in my presence.  I have reviewed the chart and discharge instructions (if applicable) and agree that the record reflects my personal performance and is accurate and complete.  05/18/2022.   I,Verona Buck,acting as a Education administrator for [employer], DO.,have documented all relevant documentation on the behalf of Ann Held, DO,as directed by  Ann Held, DO while in the presence of Ann Held, DO.    Ann Held, DO

## 2022-05-18 NOTE — Assessment & Plan Note (Signed)
ghm utd Check labs  See avs  

## 2022-05-18 NOTE — Assessment & Plan Note (Signed)
Well controlled, no changes to meds. Encouraged heart healthy diet such as the DASH diet and exercise as tolerated.  Home bp 121/82

## 2022-05-18 NOTE — Patient Instructions (Signed)
Preventive Care 40-55 Years Old, Female Preventive care refers to lifestyle choices and visits with your health care provider that can promote health and wellness. Preventive care visits are also called wellness exams. What can I expect for my preventive care visit? Counseling Your health care provider may ask you questions about your: Medical history, including: Past medical problems. Family medical history. Pregnancy history. Current health, including: Menstrual cycle. Method of birth control. Emotional well-being. Home life and relationship well-being. Sexual activity and sexual health. Lifestyle, including: Alcohol, nicotine or tobacco, and drug use. Access to firearms. Diet, exercise, and sleep habits. Work and work environment. Sunscreen use. Safety issues such as seatbelt and bike helmet use. Physical exam Your health care provider will check your: Height and weight. These may be used to calculate your BMI (body mass index). BMI is a measurement that tells if you are at a healthy weight. Waist circumference. This measures the distance around your waistline. This measurement also tells if you are at a healthy weight and may help predict your risk of certain diseases, such as type 2 diabetes and high blood pressure. Heart rate and blood pressure. Body temperature. Skin for abnormal spots. What immunizations do I need?  Vaccines are usually given at various ages, according to a schedule. Your health care provider will recommend vaccines for you based on your age, medical history, and lifestyle or other factors, such as travel or where you work. What tests do I need? Screening Your health care provider may recommend screening tests for certain conditions. This may include: Lipid and cholesterol levels. Diabetes screening. This is done by checking your blood sugar (glucose) after you have not eaten for a while (fasting). Pelvic exam and Pap test. Hepatitis B test. Hepatitis C  test. HIV (human immunodeficiency virus) test. STI (sexually transmitted infection) testing, if you are at risk. Lung cancer screening. Colorectal cancer screening. Mammogram. Talk with your health care provider about when you should start having regular mammograms. This may depend on whether you have a family history of breast cancer. BRCA-related cancer screening. This may be done if you have a family history of breast, ovarian, tubal, or peritoneal cancers. Bone density scan. This is done to screen for osteoporosis. Talk with your health care provider about your test results, treatment options, and if necessary, the need for more tests. Follow these instructions at home: Eating and drinking  Eat a diet that includes fresh fruits and vegetables, whole grains, lean protein, and low-fat dairy products. Take vitamin and mineral supplements as recommended by your health care provider. Do not drink alcohol if: Your health care provider tells you not to drink. You are pregnant, may be pregnant, or are planning to become pregnant. If you drink alcohol: Limit how much you have to 0-1 drink a day. Know how much alcohol is in your drink. In the [place] one drink equals one 12 oz bottle of beer (355 mL), one 5 oz glass of wine (148 mL), or one 1 oz glass of hard liquor (44 mL). Lifestyle Brush your teeth every morning and night with fluoride toothpaste. Floss one time each day. Exercise for at least 30 minutes 5 or more days each week. Do not use any products that contain nicotine or tobacco. These products include cigarettes, chewing tobacco, and vaping devices, such as e-cigarettes. If you need help quitting, ask your health care provider. Do not use drugs. If you are sexually active, practice safe sex. Use a condom or other form of protection to   prevent STIs. If you do not wish to become pregnant, use a form of birth control. If you plan to become pregnant, see your health care provider for a  prepregnancy visit. Take aspirin only as told by your health care provider. Make sure that you understand how much to take and what form to take. Work with your health care provider to find out whether it is safe and beneficial for you to take aspirin daily. Find healthy ways to manage stress, such as: Meditation, yoga, or listening to music. Journaling. Talking to a trusted person. Spending time with friends and family. Minimize exposure to UV radiation to reduce your risk of skin cancer. Safety Always wear your seat belt while driving or riding in a vehicle. Do not drive: If you have been drinking alcohol. Do not ride with someone who has been drinking. When you are tired or distracted. While texting. If you have been using any mind-altering substances or drugs. Wear a helmet and other protective equipment during sports activities. If you have firearms in your house, make sure you follow all gun safety procedures. Seek help if you have been physically or sexually abused. What's next? Visit your health care provider once a year for an annual wellness visit. Ask your health care provider how often you should have your eyes and teeth checked. Stay up to date on all vaccines. This information is not intended to replace advice given to you by your health care provider. Make sure you discuss any questions you have with your health care provider. Document Revised: 01/07/2021 Document Reviewed: 01/07/2021 Elsevier Patient Education  [employer].

## 2022-08-29 ENCOUNTER — Other Ambulatory Visit: Payer: Self-pay | Admitting: Family Medicine

## 2022-08-29 DIAGNOSIS — I10 Essential (primary) hypertension: Secondary | ICD-10-CM

## 2023-03-14 ENCOUNTER — Other Ambulatory Visit (HOSPITAL_BASED_OUTPATIENT_CLINIC_OR_DEPARTMENT_OTHER): Payer: Self-pay | Admitting: Family Medicine

## 2023-03-14 DIAGNOSIS — Z1231 Encounter for screening mammogram for malignant neoplasm of breast: Secondary | ICD-10-CM

## 2023-04-13 ENCOUNTER — Encounter (HOSPITAL_BASED_OUTPATIENT_CLINIC_OR_DEPARTMENT_OTHER): Payer: Self-pay

## 2023-04-13 ENCOUNTER — Other Ambulatory Visit: Payer: Self-pay

## 2023-04-13 ENCOUNTER — Emergency Department (HOSPITAL_BASED_OUTPATIENT_CLINIC_OR_DEPARTMENT_OTHER)
Admission: EM | Admit: 2023-04-13 | Discharge: 2023-04-13 | Disposition: A | Discharge status: Home / Self Care | Payer: BC Managed Care – PPO | Attending: Emergency Medicine | Admitting: Emergency Medicine

## 2023-04-13 DIAGNOSIS — R21 Rash and other nonspecific skin eruption: Secondary | ICD-10-CM | POA: Diagnosis not present

## 2023-04-13 DIAGNOSIS — B029 Zoster without complications: Secondary | ICD-10-CM | POA: Diagnosis not present

## 2023-04-13 DIAGNOSIS — I10 Essential (primary) hypertension: Secondary | ICD-10-CM | POA: Diagnosis not present

## 2023-04-13 NOTE — ED Provider Notes (Signed)
[facility]-EMERGENCY DEPT [facility] Emergency Department Provider Note MRN:  [id number]  Arrival date & time: 04/13/23     Chief Complaint   Rash   History of Present Illness   Margaret Gibson is a 57 y.o. year-old female with a history of hypertension presenting to the ED with chief complaint of rash.  Rash on the left side of the neck that started 6 days ago.  Seems to have scabbed over.  Has been trying hydrocortisone cream but it is not helping.  No other symptoms.  Review of Systems  A thorough review of systems was obtained and all systems are negative except as noted in the HPI and PMH.   Patient's Health History    Past Medical History:  Diagnosis Date   HTN (hypertension)     Past Surgical History:  Procedure Laterality Date   LEG SURGERY  08/2008   rod implant    Family History  Problem Relation Age of Onset   Hyperlipidemia Mother    Hypertension Father    Stroke Father    Cancer Sister 73       breast    Social History   Socioeconomic History   Marital status: Married    Spouse name: Not on file   Number of children: Not on file   Years of education: Not on file   Highest education level: Not on file  Occupational History    Employer: [employer]    Comment: [employer]  Tobacco Use   Smoking status: Never   Smokeless tobacco: Never  Substance and Sexual Activity   Alcohol use: No   Drug use: No   Sexual activity: Yes    Partners: Male  Other Topics Concern   Not on file  Social History Narrative   Exercise -- pedometer 10.000 steps   Social Determinants of Health   Financial Resource Strain: Not on file  Food Insecurity: Not on file  Transportation Needs: Not on file  Physical Activity: Not on file  Stress: Not on file  Social Connections: Not on file  Intimate Partner Violence: Not on file     Physical Exam   Vitals:   04/13/23 0535  BP: (!) 179/117  Pulse: (!) 107  Resp: 17  Temp: 98.2 F (36.8 C)  SpO2: 99%     CONSTITUTIONAL: Well-appearing, NAD NEURO/PSYCH:  Alert and oriented x 3, no focal deficits EYES:  eyes equal and reactive ENT/NECK:  no LAD, no JVD CARDIO: Regular rate, well-perfused, normal S1 and S2 PULM:  CTAB no wheezing or rhonchi GI/GU:  non-distended, non-tender MSK/SPINE:  No gross deformities, no edema SKIN: Excoriated rash along the left C3 dermatomal distribution   *Additional and/or pertinent findings included in MDM below  Diagnostic and Interventional Summary    EKG Interpretation Date/Time:    Ventricular Rate:    PR Interval:    QRS Duration:    QT Interval:    QTC Calculation:   R Axis:      Text Interpretation:         Labs Reviewed - No data to display  No orders to display    Medications - No data to display   Procedures  /  Critical Care Procedures  ED Course and Medical Decision Making  Initial Impression and Ddx Suspect shingles along the left C3 nerve root.  Otherwise no complaints, well-appearing, no signs of disseminated infection.  The lesions seem dry, I do not see any new lesions and given that it  started 6 days ago and patient is immunocompetent there is no benefit to providing valacyclovir.  Luckily patient is not experiencing a lot of pain, mostly just some sensitive skin, mild discomfort.  Past medical/surgical history that increases complexity of ED encounter: Hypertension  Interpretation of Diagnostics Laboratory and/or imaging options to aid in the diagnosis/care of the patient were considered.  After careful history and physical examination, it was determined that there was no indication for diagnostics at this time.  Patient Reassessment and Ultimate Disposition/Management     Discharge  Patient management required discussion with the following services or consulting groups:  None  Complexity of Problems Addressed Acute complicated illness or Injury  Additional Data Reviewed and Analyzed Further history obtained  from: None  Additional Factors Impacting ED Encounter Risk None  Elmer Sow. Pilar Plate, MD [facility] Emergency Medicine [facility] [email or web address]  Final Clinical Impressions(s) / ED Diagnoses     ICD-10-CM   1. Herpes zoster without complication  B02.9       ED Discharge Orders     None        Discharge Instructions Discussed with and Provided to Patient:   Discharge Instructions      You were evaluated in the Emergency Department and after careful evaluation, we did not find any emergent condition requiring admission or further testing in the hospital.  Your exam/testing today was overall reassuring.  Rash is likely due to shingles.  You are outside of the window for the antivirus medicine.  Can use tylenol or ibuprofen if rash is causing pain.  Can use neosporin daily to help with scarring and prevent secondary bacterial infection.  Please return to the Emergency Department if you experience any worsening of your condition.  Thank you for allowing [place] to be a part of your care.       Sabas Sous, MD 04/13/23 [phone]

## 2023-04-13 NOTE — Discharge Instructions (Addendum)
You were evaluated in the Emergency Department and after careful evaluation, we did not find any emergent condition requiring admission or further testing in the hospital.  Your exam/testing today was overall reassuring.  Rash is likely due to shingles.  You are outside of the window for the antivirus medicine.  Can use tylenol or ibuprofen if rash is causing pain.  Can use neosporin daily to help with scarring and prevent secondary bacterial infection.  Please return to the Emergency Department if you experience any worsening of your condition.  Thank you for allowing [place] to be a part of your care.

## 2023-04-13 NOTE — ED Triage Notes (Signed)
Pt reports rash on front and back left side of neck. States it is sensitive and itchy. Has tried hydrocortisone with no relief. Rash is scabbed over.

## 2023-05-05 ENCOUNTER — Ambulatory Visit (HOSPITAL_BASED_OUTPATIENT_CLINIC_OR_DEPARTMENT_OTHER)
Admission: RE | Admit: 2023-05-05 | Discharge: 2023-05-05 | Disposition: A | Discharge status: Home / Self Care | Payer: BC Managed Care – PPO | Source: Ambulatory Visit | Attending: Family Medicine | Admitting: Family Medicine

## 2023-05-05 ENCOUNTER — Encounter (HOSPITAL_BASED_OUTPATIENT_CLINIC_OR_DEPARTMENT_OTHER): Payer: Self-pay

## 2023-05-05 DIAGNOSIS — Z1231 Encounter for screening mammogram for malignant neoplasm of breast: Secondary | ICD-10-CM | POA: Insufficient documentation

## 2023-05-20 ENCOUNTER — Encounter: Payer: Self-pay | Admitting: Family Medicine

## 2023-05-20 ENCOUNTER — Ambulatory Visit (INDEPENDENT_AMBULATORY_CARE_PROVIDER_SITE_OTHER): Payer: BC Managed Care – PPO | Admitting: Family Medicine

## 2023-05-20 VITALS — BP 132/90 | HR 98 | Temp 98.1°F | Resp 18 | Ht 64.0 in | Wt 131.4 lb

## 2023-05-20 DIAGNOSIS — I10 Essential (primary) hypertension: Secondary | ICD-10-CM

## 2023-05-20 DIAGNOSIS — Z0000 Encounter for general adult medical examination without abnormal findings: Secondary | ICD-10-CM

## 2023-05-20 DIAGNOSIS — Z1211 Encounter for screening for malignant neoplasm of colon: Secondary | ICD-10-CM

## 2023-05-20 LAB — CBC WITH DIFFERENTIAL/PLATELET
Basophils Absolute: 0 10*3/uL (ref 0.0–0.1)
Basophils Relative: 1 % (ref 0.0–3.0)
Eosinophils Absolute: 0.1 10*3/uL (ref 0.0–0.7)
Eosinophils Relative: 1.7 % (ref 0.0–5.0)
HCT: 39.9 % (ref 36.0–46.0)
Hemoglobin: 12.4 g/dL (ref 12.0–15.0)
Lymphocytes Relative: 33 % (ref 12.0–46.0)
Lymphs Abs: 1.4 10*3/uL (ref 0.7–4.0)
MCHC: 31 g/dL (ref 30.0–36.0)
MCV: 84.8 fL (ref 78.0–100.0)
Monocytes Absolute: 0.3 10*3/uL (ref 0.1–1.0)
Monocytes Relative: 8.2 % (ref 3.0–12.0)
Neutro Abs: 2.3 10*3/uL (ref 1.4–7.7)
Neutrophils Relative %: 56.1 % (ref 43.0–77.0)
Platelets: 262 10*3/uL (ref 150.0–400.0)
RBC: 4.71 Mil/uL (ref 3.87–5.11)
RDW: 13 % (ref 11.5–15.5)
WBC: 4.1 10*3/uL (ref 4.0–10.5)

## 2023-05-20 LAB — COMPREHENSIVE METABOLIC PANEL
ALT: 12 U/L (ref 0–35)
AST: 23 U/L (ref 0–37)
Albumin: 4.8 g/dL (ref 3.5–5.2)
Alkaline Phosphatase: 81 U/L (ref 39–117)
BUN: 15 mg/dL (ref 6–23)
CO2: 30 meq/L (ref 19–32)
Calcium: 10 mg/dL (ref 8.4–10.5)
Chloride: 101 meq/L (ref 96–112)
Creatinine, Ser: 0.83 mg/dL (ref 0.40–1.20)
GFR: 78.96 mL/min (ref >60.00)
Glucose, Bld: 82 mg/dL (ref 70–99)
Potassium: 4.2 meq/L (ref 3.5–5.1)
Sodium: 140 meq/L (ref 135–145)
Total Bilirubin: 0.7 mg/dL (ref 0.2–1.2)
Total Protein: 8.1 g/dL (ref 6.0–8.3)

## 2023-05-20 LAB — LIPID PANEL
Cholesterol: 251 mg/dL — ABNORMAL HIGH (ref 0–200)
HDL: 73 mg/dL (ref >39.00)
LDL Cholesterol: 168 mg/dL — ABNORMAL HIGH (ref 0–99)
NonHDL: 177.61
Total CHOL/HDL Ratio: 3
Triglycerides: 50 mg/dL (ref 0.0–149.0)
VLDL: 10 mg/dL (ref 0.0–40.0)

## 2023-05-20 LAB — TSH: TSH: 1.44 u[IU]/mL (ref 0.35–5.50)

## 2023-05-20 MED ORDER — AMLODIPINE BESYLATE 10 MG PO TABS: ORAL_TABLET | ORAL | 3 refills | Status: DC

## 2023-05-20 NOTE — Progress Notes (Signed)
Established Patient Office Visit  Subjective   Patient ID: Margaret Gibson, female    DOB: 1965-08-07  Age: 57 y.o. MRN: [id number]  Chief Complaint  Patient presents with   Annual Exam    Pt states fasting.     HPI Discussed the use of AI scribe software for clinical note transcription with the patient, who gave verbal consent to proceed.  History of Present Illness   The patient, with a history of hypertension, presents for a routine check-up. She has been taking one of her two prescribed blood pressure medications, specifically Norvasc, and has not been taking Metoprolol. She reports that her 90-day average blood pressure is 129/82. She also mentions a mole on her leg, which she has noticed but is not overly concerned about. She states that her family has a history of moles, but no history of skin cancer.  The patient also discusses her husband's health issues, including diabetes and kidney problems, which have been causing her some stress. She mentions that she is the primary caregiver for her husband, which may be contributing to her own health concerns.  The patient is due for a colonoscopy, but due to her current circumstances, she opts for a Cologuard test instead. She also discusses her previous experience with shingles and is advised by the doctor to get a shingles vaccine.      Patient Active Problem List   Diagnosis Date Noted   Primary hypertension 05/18/2022   Screening for malignant neoplasm of cervix 10/04/2011   Preventative health care 10/27/2010   Past Medical History:  Diagnosis Date   HTN (hypertension)    Past Surgical History:  Procedure Laterality Date   LEG SURGERY  08/2008   rod implant   Social History   Tobacco Use   Smoking status: Never   Smokeless tobacco: Never  Substance Use Topics   Alcohol use: No   Drug use: No   Social History   Socioeconomic History   Marital status: Married    Spouse name: Not on file   Number of children: Not  on file   Years of education: Not on file   Highest education level: Not on file  Occupational History    Employer: [employer]    Comment: [employer]  Tobacco Use   Smoking status: Never   Smokeless tobacco: Never  Substance and Sexual Activity   Alcohol use: No   Drug use: No   Sexual activity: Yes    Partners: Male  Other Topics Concern   Not on file  Social History Narrative   Exercise -- pedometer 10.000 steps   Social Determinants of Health   Financial Resource Strain: Not on file  Food Insecurity: Not on file  Transportation Needs: Not on file  Physical Activity: Not on file  Stress: Not on file  Social Connections: Not on file  Intimate Partner Violence: Not on file   Family Status  Relation Name Status   Mother  Alive   Father  Deceased at age 27   Sister  Alive  No partnership data on file   Family History  Problem Relation Age of Onset   Hyperlipidemia Mother    Hypertension Father    Stroke Father    Cancer Sister 63       breast   No Known Allergies    ROS    Objective:     BP (!) 132/90 (BP Location: Left Arm, Patient Position: Sitting, Cuff Size: Normal)   Pulse 98  Temp 98.1 F (36.7 C) (Oral)   Resp 18   Ht 5\' 4"  (1.626 m)   Wt 131 lb 6.4 oz (59.6 kg)   LMP 04/14/2018   SpO2 99%   BMI 22.55 kg/m  BP Readings from Last 3 Encounters:  05/20/23 (!) 132/90  04/13/23 (!) 179/117  05/18/22 (!) 142/98   Wt Readings from Last 3 Encounters:  05/20/23 131 lb 6.4 oz (59.6 kg)  04/13/23 135 lb (61.2 kg)  05/18/22 134 lb 9.6 oz (61.1 kg)   SpO2 Readings from Last 3 Encounters:  05/20/23 99%  04/13/23 99%  05/18/22 99%      Physical Exam   No results found for any visits on 05/20/23.  Last CBC Lab Results  Component Value Date   WBC 5.2 05/18/2022   HGB 12.5 05/18/2022   HCT 39.4 05/18/2022   MCV 85.1 05/18/2022   MCH 27.8 05/02/2020   RDW 12.8 05/18/2022   PLT 242.0 05/18/2022   Last metabolic panel Lab Results   Component Value Date   GLUCOSE 84 05/18/2022   NA 139 05/18/2022   K 4.2 05/18/2022   CL 103 05/18/2022   CO2 29 05/18/2022   BUN 14 05/18/2022   CREATININE 0.76 05/18/2022   GFR 88.38 05/18/2022   CALCIUM 9.9 05/18/2022   PROT 8.3 05/18/2022   ALBUMIN 4.7 05/18/2022   BILITOT 0.5 05/18/2022   ALKPHOS 79 05/18/2022   AST 25 05/18/2022   ALT 14 05/18/2022   Last lipids Lab Results  Component Value Date   CHOL 257 (H) 05/18/2022   HDL 72.60 05/18/2022   LDLCALC 176 (H) 05/18/2022   LDLDIRECT 136.5 10/31/2012   TRIG 45.0 05/18/2022   CHOLHDL 4 05/18/2022   Last hemoglobin A1c No results found for: "HGBA1C" Last thyroid functions Lab Results  Component Value Date   TSH 1.46 05/18/2022   Last vitamin D Lab Results  Component Value Date   VD25OH 45.75 01/02/2015   Last vitamin B12 and Folate No results found for: "VITAMINB12", "FOLATE"    The 10-year ASCVD risk score (Arnett DK, et al., 2019) is: 5.7%    Assessment & Plan:   Problem List Items Addressed This Visit       Unprioritized   Primary hypertension   Relevant Medications   amLODipine (NORVASC) 10 MG tablet   Other Relevant Orders   CBC with Differential/Platelet   Comprehensive metabolic panel   Lipid panel   Preventative health care - Primary   Relevant Orders   CBC with Differential/Platelet   Comprehensive metabolic panel   Lipid panel   TSH   Other Visit Diagnoses     Colon cancer screening       Relevant Orders   Cologuard   Essential hypertension       Relevant Medications   amLODipine (NORVASC) 10 MG tablet     Assessment and Plan    Hypertension Elevated blood pressure at today's visit. Patient reports inconsistent use of Metoprolol. -Continue Norvasc as currently prescribed. -Encourage consistent use of Metoprolol. -Refill Norvasc prescription.  Colon Cancer Screening No prior colonoscopy. Patient agrees to Cologuard due to personal circumstances. -Order Cologuard test  for patient to complete at home.  Shingles Vaccination Unclear history of prior shingles infection. Patient initially declined vaccination due to upcoming travel plans. -Discuss shingles vaccination at next visit or upon return from travel.  General Health Maintenance -Recent mammogram results to be reviewed. -Next Pap smear due October 2025. -Tetanus vaccine due next year. -Blood  work to be completed.        No follow-ups on file.    Donato Schultz, DO

## 2023-06-19 DIAGNOSIS — Z1211 Encounter for screening for malignant neoplasm of colon: Secondary | ICD-10-CM | POA: Diagnosis not present

## 2023-06-27 LAB — COLOGUARD: COLOGUARD: NEGATIVE

## 2023-12-29 ENCOUNTER — Other Ambulatory Visit (HOSPITAL_BASED_OUTPATIENT_CLINIC_OR_DEPARTMENT_OTHER): Payer: Self-pay | Admitting: Family Medicine

## 2023-12-29 DIAGNOSIS — Z1231 Encounter for screening mammogram for malignant neoplasm of breast: Secondary | ICD-10-CM

## 2024-05-07 ENCOUNTER — Encounter (HOSPITAL_BASED_OUTPATIENT_CLINIC_OR_DEPARTMENT_OTHER): Payer: Self-pay

## 2024-05-07 ENCOUNTER — Ambulatory Visit (HOSPITAL_BASED_OUTPATIENT_CLINIC_OR_DEPARTMENT_OTHER)
Admission: RE | Admit: 2024-05-07 | Discharge: 2024-05-07 | Disposition: A | Discharge status: Home / Self Care | Source: Ambulatory Visit | Attending: Family Medicine | Admitting: Family Medicine

## 2024-05-07 DIAGNOSIS — Z1231 Encounter for screening mammogram for malignant neoplasm of breast: Secondary | ICD-10-CM | POA: Insufficient documentation

## 2024-05-21 ENCOUNTER — Ambulatory Visit (INDEPENDENT_AMBULATORY_CARE_PROVIDER_SITE_OTHER): Admitting: Family Medicine

## 2024-05-21 ENCOUNTER — Encounter: Payer: Self-pay | Admitting: Family Medicine

## 2024-05-21 ENCOUNTER — Other Ambulatory Visit (HOSPITAL_COMMUNITY)
Admission: RE | Admit: 2024-05-21 | Discharge: 2024-05-21 | Disposition: A | Discharge status: Home / Self Care | Source: Ambulatory Visit | Attending: Family Medicine | Admitting: Family Medicine

## 2024-05-21 VITALS — BP 125/70 | HR 102 | Temp 98.2°F | Resp 16 | Ht 64.0 in | Wt 125.0 lb

## 2024-05-21 DIAGNOSIS — Z780 Asymptomatic menopausal state: Secondary | ICD-10-CM | POA: Insufficient documentation

## 2024-05-21 DIAGNOSIS — Z1151 Encounter for screening for human papillomavirus (HPV): Secondary | ICD-10-CM | POA: Diagnosis not present

## 2024-05-21 DIAGNOSIS — Z0000 Encounter for general adult medical examination without abnormal findings: Secondary | ICD-10-CM

## 2024-05-21 DIAGNOSIS — Z23 Encounter for immunization: Secondary | ICD-10-CM | POA: Diagnosis not present

## 2024-05-21 DIAGNOSIS — Z01419 Encounter for gynecological examination (general) (routine) without abnormal findings: Secondary | ICD-10-CM | POA: Diagnosis not present

## 2024-05-21 DIAGNOSIS — I10 Essential (primary) hypertension: Secondary | ICD-10-CM | POA: Diagnosis not present

## 2024-05-21 LAB — COMPREHENSIVE METABOLIC PANEL WITH GFR
ALT: 13 U/L (ref 0–35)
AST: 22 U/L (ref 0–37)
Albumin: 4.7 g/dL (ref 3.5–5.2)
Alkaline Phosphatase: 82 U/L (ref 39–117)
BUN: 15 mg/dL (ref 6–23)
CO2: 30 meq/L (ref 19–32)
Calcium: 9.7 mg/dL (ref 8.4–10.5)
Chloride: 101 meq/L (ref 96–112)
Creatinine, Ser: 0.76 mg/dL (ref 0.40–1.20)
GFR: 87.14 mL/min (ref >60.00)
Glucose, Bld: 88 mg/dL (ref 70–99)
Potassium: 4 meq/L (ref 3.5–5.1)
Sodium: 139 meq/L (ref 135–145)
Total Bilirubin: 0.5 mg/dL (ref 0.2–1.2)
Total Protein: 8 g/dL (ref 6.0–8.3)

## 2024-05-21 LAB — LIPID PANEL
Cholesterol: 246 mg/dL — ABNORMAL HIGH (ref 0–200)
HDL: 80.5 mg/dL (ref >39.00)
LDL Cholesterol: 156 mg/dL — ABNORMAL HIGH (ref 0–99)
NonHDL: 165.96
Total CHOL/HDL Ratio: 3
Triglycerides: 48 mg/dL (ref 0.0–149.0)
VLDL: 9.6 mg/dL (ref 0.0–40.0)

## 2024-05-21 LAB — CBC WITH DIFFERENTIAL/PLATELET
Basophils Absolute: 0 K/uL (ref 0.0–0.1)
Basophils Relative: 0.7 % (ref 0.0–3.0)
Eosinophils Absolute: 0.1 K/uL (ref 0.0–0.7)
Eosinophils Relative: 1.5 % (ref 0.0–5.0)
HCT: 37.5 % (ref 36.0–46.0)
Hemoglobin: 12.1 g/dL (ref 12.0–15.0)
Lymphocytes Relative: 19.1 % (ref 12.0–46.0)
Lymphs Abs: 0.8 K/uL (ref 0.7–4.0)
MCHC: 32.3 g/dL (ref 30.0–36.0)
MCV: 83.6 fl (ref 78.0–100.0)
Monocytes Absolute: 0.3 K/uL (ref 0.1–1.0)
Monocytes Relative: 7.5 % (ref 3.0–12.0)
Neutro Abs: 3.1 K/uL (ref 1.4–7.7)
Neutrophils Relative %: 71.2 % (ref 43.0–77.0)
Platelets: 253 K/uL (ref 150.0–400.0)
RBC: 4.49 Mil/uL (ref 3.87–5.11)
RDW: 12.8 % (ref 11.5–15.5)
WBC: 4.4 K/uL (ref 4.0–10.5)

## 2024-05-21 LAB — TSH: TSH: 1.96 u[IU]/mL (ref 0.35–5.50)

## 2024-05-21 NOTE — Assessment & Plan Note (Signed)
 Ghm utd Check labs  See AVS Health Maintenance  Topic Date Due   HIV Screening  Never done   DTaP/Tdap/Td (2 - Td or Tdap) 11/06/2023   Cervical Cancer Screening (HPV/Pap Cotest)  05/08/2024   COVID-19 Vaccine (5 - 2025-26 season) 06/06/2024 (Originally 03/26/2024)   Zoster Vaccines- Shingrix (1 of 2) 08/21/2024 (Originally 03/25/2017)   Influenza Vaccine  10/23/2024 (Originally 02/24/2024)   Pneumococcal Vaccine: 50+ Years (1 of 1 - PCV) 05/21/2025 (Originally 03/25/2017)   Hepatitis B Vaccines 19-59 Average Risk (1 of 3 - 19+ 3-dose series) 05/21/2025 (Originally 03/25/1986)   Mammogram  05/07/2025   Fecal DNA (Cologuard)  06/18/2026   Hepatitis C Screening  Completed   HPV VACCINES  Aged Out   Meningococcal B Vaccine  Aged Out

## 2024-05-21 NOTE — Assessment & Plan Note (Signed)
 Well controlled, no changes to meds. Encouraged heart healthy diet such as the DASH diet and exercise as tolerated.

## 2024-05-21 NOTE — Progress Notes (Signed)
 Subjective:    Patient ID: Margaret Gibson, female    DOB: Jul 11, 1966, 58 y.o.   MRN: [id number]  Chief Complaint  Patient presents with   Annual Exam    Pt states fasting     HPI Patient is in today for cpe.  Discussed the use of AI scribe software for clinical note transcription with the patient, who gave verbal consent to proceed.  History of Present Illness Margaret Gibson is a 58 year old female who presents for an annual physical exam.  She is due for a Pap smear, which she last had in 2022. She completed a Cologuard test last November, which was negative, and has had a recent mammogram with results already received.  She has not received a shingles vaccine and is reluctant to get it due to concerns about side effects. She is also due for a tetanus shot, which was last due in April.  Her blood pressure has been well-controlled since starting magnesium glycinate daily. Home readings are typically around 120/72 mmHg, with a maximum of 136/83 mmHg. She is frustrated that she was not informed earlier about the benefits of magnesium for her blood pressure.  She regularly visits the eye doctor and dentist, with her next dental cleaning scheduled for November 20th. She plans to schedule her eye appointment in December.  She uses [employer] collagen daily, which she finds beneficial for her mobility. She also has issues with earwax buildup, particularly in her left ear, and uses ear drops to help manage this.  She reports no joint issues and mentions a stable mole on her body that has not changed. She mentions a stable mole on her body that has not changed.    Past Medical History:  Diagnosis Date   HTN (hypertension)     Past Surgical History:  Procedure Laterality Date   LEG SURGERY  08/2008   rod implant    Family History  Problem Relation Age of Onset   Hyperlipidemia Mother    Hypertension Father    Stroke Father    Cancer Sister 61        breast    Social History   Socioeconomic History   Marital status: Married    Spouse name: Not on file   Number of children: Not on file   Years of education: Not on file   Highest education level: Not on file  Occupational History    Employer: [employer]    Comment: [employer]  Tobacco Use   Smoking status: Never   Smokeless tobacco: Never  Substance and Sexual Activity   Alcohol use: No   Drug use: No   Sexual activity: Yes    Partners: Male  Other Topics Concern   Not on file  Social History Narrative   Exercise -- pedometer 10.000 steps   Social Drivers of Corporate Investment Banker Strain: Not on file  Food Insecurity: Not on file  Transportation Needs: Not on file  Physical Activity: Not on file  Stress: Not on file  Social Connections: Not on file  Intimate Partner Violence: Not on file    Outpatient Medications Prior to Visit  Medication Sig Dispense Refill   amLODipine  (NORVASC ) 10 MG tablet TAKE 1 TABLET BY MOUTH EVERY DAY 90 tablet 3   Flaxseed, Linseed, (FLAX SEED OIL PO) Take by mouth daily.       GARLIC PO Take by mouth.  Multiple Vitamins-Minerals (HAIR/SKIN/NAILS) TABS Take by mouth daily.       Omega-3 Fatty Acids (FISH OIL TRIPLE STRENGTH) 1400 MG CAPS Take by mouth daily.       No facility-administered medications prior to visit.    No Known Allergies  Review of Systems  Constitutional:  Negative for fever and malaise/fatigue.  HENT:  Negative for congestion.   Eyes:  Negative for blurred vision.  Respiratory:  Negative for shortness of breath.   Cardiovascular:  Negative for chest pain, palpitations and leg swelling.  Gastrointestinal:  Negative for abdominal pain, blood in stool and nausea.  Genitourinary:  Negative for dysuria and frequency.  Musculoskeletal:  Negative for falls.  Skin:  Negative for rash.  Neurological:  Negative for dizziness, loss of consciousness and headaches.  Endo/Heme/Allergies:  Negative for  environmental allergies.  Psychiatric/Behavioral:  Negative for depression. The patient is not nervous/anxious.        Objective:    Physical Exam Vitals and nursing note reviewed. Exam conducted with a chaperone present.  Constitutional:      General: She is not in acute distress.    Appearance: Normal appearance. She is well-developed.  HENT:     Head: Normocephalic and atraumatic.     Right Ear: Tympanic membrane, ear canal and external ear normal. There is no impacted cerumen.     Left Ear: Tympanic membrane, ear canal and external ear normal. There is no impacted cerumen.     Nose: Nose normal.     Mouth/Throat:     Mouth: Mucous membranes are moist.     Pharynx: Oropharynx is clear. No oropharyngeal exudate or posterior oropharyngeal erythema.  Eyes:     General: No scleral icterus.       Right eye: No discharge.        Left eye: No discharge.     Conjunctiva/sclera: Conjunctivae normal.     Pupils: Pupils are equal, round, and reactive to light.  Neck:     Thyroid : No thyromegaly or thyroid  tenderness.     Vascular: No JVD.  Cardiovascular:     Rate and Rhythm: Normal rate and regular rhythm.     Heart sounds: Normal heart sounds. No murmur heard. Pulmonary:     Effort: Pulmonary effort is normal. No respiratory distress.     Breath sounds: Normal breath sounds.  Abdominal:     General: Bowel sounds are normal. There is no distension.     Palpations: Abdomen is soft. There is no mass.     Tenderness: There is no abdominal tenderness. There is no guarding or rebound.     Hernia: There is no hernia in the left inguinal area or right inguinal area.  Genitourinary:    Exam position: Lithotomy position.     Labia:        Right: No rash, tenderness, lesion or injury.        Left: No rash, tenderness, lesion or injury.      Vagina: Normal.     Uterus: Normal.      Adnexa: Right adnexa normal and left adnexa normal.     Rectum: Normal. Guaiac result negative.   Musculoskeletal:        General: Normal range of motion.     Cervical back: Normal range of motion and neck supple.     Right lower leg: No edema.     Left lower leg: No edema.  Lymphadenopathy:     Cervical: No cervical adenopathy.  Skin:  General: Skin is warm and dry.     Findings: No erythema or rash.  Neurological:     Mental Status: She is alert and oriented to person, place, and time.     Cranial Nerves: No cranial nerve deficit.     Deep Tendon Reflexes: Reflexes are normal and symmetric.  Psychiatric:        Mood and Affect: Mood normal.        Behavior: Behavior normal.        Thought Content: Thought content normal.        Judgment: Judgment normal.     BP 125/70 (BP Location: Left Arm, Patient Position: Sitting, Cuff Size: Normal)   Pulse (!) 102   Temp 98.2 F (36.8 C) (Oral)   Resp 16   Ht 5' 4 (1.626 m)   Wt 125 lb (56.7 kg)   LMP 04/14/2018   SpO2 100%   BMI 21.46 kg/m  Wt Readings from Last 3 Encounters:  05/21/24 125 lb (56.7 kg)  05/20/23 131 lb 6.4 oz (59.6 kg)  04/13/23 135 lb (61.2 kg)    Diabetic Foot Exam - Simple   No data filed    Lab Results  Component Value Date   WBC 4.1 05/20/2023   HGB 12.4 05/20/2023   HCT 39.9 05/20/2023   PLT 262.0 05/20/2023   GLUCOSE 82 05/20/2023   CHOL 251 (H) 05/20/2023   TRIG 50.0 05/20/2023   HDL 73.00 05/20/2023   LDLDIRECT 136.5 10/31/2012   LDLCALC 168 (H) 05/20/2023   ALT 12 05/20/2023   AST 23 05/20/2023   NA 140 05/20/2023   K 4.2 05/20/2023   CL 101 05/20/2023   CREATININE 0.83 05/20/2023   BUN 15 05/20/2023   CO2 30 05/20/2023   TSH 1.44 05/20/2023    Lab Results  Component Value Date   TSH 1.44 05/20/2023   Lab Results  Component Value Date   WBC 4.1 05/20/2023   HGB 12.4 05/20/2023   HCT 39.9 05/20/2023   MCV 84.8 05/20/2023   PLT 262.0 05/20/2023   Lab Results  Component Value Date   NA 140 05/20/2023   K 4.2 05/20/2023   CO2 30 05/20/2023   GLUCOSE 82  05/20/2023   BUN 15 05/20/2023   CREATININE 0.83 05/20/2023   BILITOT 0.7 05/20/2023   ALKPHOS 81 05/20/2023   AST 23 05/20/2023   ALT 12 05/20/2023   PROT 8.1 05/20/2023   ALBUMIN 4.8 05/20/2023   CALCIUM 10.0 05/20/2023   GFR 78.96 05/20/2023   Lab Results  Component Value Date   CHOL 251 (H) 05/20/2023   Lab Results  Component Value Date   HDL 73.00 05/20/2023   Lab Results  Component Value Date   LDLCALC 168 (H) 05/20/2023   Lab Results  Component Value Date   TRIG 50.0 05/20/2023   Lab Results  Component Value Date   CHOLHDL 3 05/20/2023   No results found for: HGBA1C     Assessment & Plan:  Preventative health care Assessment & Plan: Ghm utd Check labs  See AVS Health Maintenance  Topic Date Due   HIV Screening  Never done   DTaP/Tdap/Td (2 - Td or Tdap) 11/06/2023   Cervical Cancer Screening (HPV/Pap Cotest)  05/08/2024   COVID-19 Vaccine (5 - 2025-26 season) 06/06/2024 (Originally 03/26/2024)   Zoster Vaccines- Shingrix (1 of 2) 08/21/2024 (Originally 03/25/2017)   Influenza Vaccine  10/23/2024 (Originally 02/24/2024)   Pneumococcal Vaccine: 50+ Years (1 of 1 - PCV)  05/21/2025 (Originally 03/25/2017)   Hepatitis B Vaccines 19-59 Average Risk (1 of 3 - 19+ 3-dose series) 05/21/2025 (Originally 03/25/1986)   Mammogram  05/07/2025   Fecal DNA (Cologuard)  06/18/2026   Hepatitis C Screening  Completed   HPV VACCINES  Aged Out   Meningococcal B Vaccine  Aged Out     Orders: -     CBC with Differential/Platelet -     Comprehensive metabolic panel with GFR -     Lipid panel -     TSH -     Cytology - PAP  Primary hypertension Assessment & Plan: Well controlled, no changes to meds. Encouraged heart healthy diet such as the DASH diet and exercise as tolerated.    Orders: -     CBC with Differential/Platelet -     Comprehensive metabolic panel with GFR  Need for Tdap vaccination -     Tdap vaccine greater than or equal to 7yo IM   Assessment and  Plan Assessment & Plan Adult Wellness Visit   This routine adult wellness visit includes performing a Pap smear, as the last one was in 2022. The Cologuard test from last November was negative. Mammogram results have been received. She maintains regular eye and dental check-ups. Perform Pap smear today and continue regular eye and dental check-ups.  Essential hypertension   Home blood pressure readings are consistently around 120/72 mmHg, though a recent office reading was 140/70 mmHg, likely due to stress. Magnesium supplementation has been beneficial for maintaining blood pressure and reducing stress. Continue current magnesium supplementation regimen and monitor blood pressure regularly at home.  Cerumen impaction, left ear   Cerumen impaction is noted in the left ear. She has not been using ear drops despite having them. Proper ear hygiene was advised to prevent impaction. Recommend using over-the-counter ear drops to liquefy and remove earwax and advise against using Q-tips to prevent further impaction.  General Health Maintenance   She is due for tetanus and shingles vaccinations. The benefits and potential side effects of the shingles vaccine, including flu-like symptoms and sore arm, were discussed. Although hesitant, she agrees to proceed with vaccinations to avoid future visits. Administer tetanus .  She will get shingrix another time or at her pharmacy.     Pawel Soules R Lowne Chase, DO

## 2024-05-23 ENCOUNTER — Ambulatory Visit: Payer: Self-pay | Admitting: Family Medicine

## 2024-05-25 ENCOUNTER — Encounter: Admitting: Family Medicine

## 2024-05-25 LAB — CYTOLOGY - PAP
Chlamydia: NEGATIVE
Comment: NEGATIVE
Comment: NEGATIVE
Comment: NEGATIVE
Comment: NORMAL
Diagnosis: NEGATIVE
HSV1: NEGATIVE
HSV2: NEGATIVE
Neisseria Gonorrhea: NEGATIVE
Trichomonas: NEGATIVE

## 2024-05-30 ENCOUNTER — Encounter: Payer: Self-pay | Admitting: Family Medicine

## 2024-05-30 DIAGNOSIS — I10 Essential (primary) hypertension: Secondary | ICD-10-CM

## 2024-05-30 MED ORDER — AMLODIPINE BESYLATE 10 MG PO TABS
ORAL_TABLET | ORAL | 1 refills | Status: AC
Start: 2024-05-30 — End: ?

## 2024-06-27 ENCOUNTER — Encounter: Payer: Self-pay | Admitting: Family Medicine

## 2024-06-27 DIAGNOSIS — N942 Vaginismus: Secondary | ICD-10-CM
